# Patient Record
Sex: Female | Born: 1980 | Race: Black or African American | Hispanic: No | Marital: Married | Smoking: Never smoker
Health system: Southern US, Community
[De-identification: ages and names within clinical notes are randomized; demographics above are authoritative.]

## PROBLEM LIST (undated history)

## (undated) DIAGNOSIS — Z789 Other specified health status: Secondary | ICD-10-CM

## (undated) HISTORY — PX: MOUTH SURGERY: SHX715

## (undated) HISTORY — PX: GALLBLADDER SURGERY: SHX652

## (undated) HISTORY — DX: Other specified health status: Z78.9

---

## 2015-11-14 DIAGNOSIS — E669 Obesity, unspecified: Secondary | ICD-10-CM | POA: Insufficient documentation

## 2018-03-07 ENCOUNTER — Encounter: Payer: Self-pay | Admitting: Advanced Practice Midwife

## 2018-03-07 ENCOUNTER — Ambulatory Visit (INDEPENDENT_AMBULATORY_CARE_PROVIDER_SITE_OTHER): Payer: BC Managed Care – PPO | Admitting: Advanced Practice Midwife

## 2018-03-07 VITALS — BP 100/70 | Ht 69.0 in | Wt 208.0 lb

## 2018-03-07 DIAGNOSIS — Z0000 Encounter for general adult medical examination without abnormal findings: Secondary | ICD-10-CM

## 2018-03-07 DIAGNOSIS — Z01419 Encounter for gynecological examination (general) (routine) without abnormal findings: Secondary | ICD-10-CM | POA: Diagnosis not present

## 2018-03-07 DIAGNOSIS — Z3041 Encounter for surveillance of contraceptive pills: Secondary | ICD-10-CM

## 2018-03-07 MED ORDER — DESOGESTREL-ETHINYL ESTRADIOL 0.15-30 MG-MCG PO TABS: ORAL_TABLET | ORAL | 4 refills | Status: DC

## 2018-03-07 NOTE — Progress Notes (Signed)
Patient ID: Julie Conrad, female   DOB: [birth year], 37 y.o.   MRN: [id number]     Gynecology Annual Exam   PCP: Patient, No Pcp Per  Chief Complaint:  Chief Complaint  Patient presents with  . Gynecologic Exam  . Contraception    History of Present Illness: Patient is a 37 y.o. G3 P2012 presents for annual exam. The patient has no complaints today. She is establishing care in this area and needs a refill of her birth control.  LMP: Patient's last menstrual period was 02/18/2018. Average Interval: irregular, every 2-3 months  Duration of flow: 7 days Heavy Menses: no Clots: no Intermenstrual Bleeding: no Postcoital Bleeding: no Dysmenorrhea: no  The patient is sexually active. She currently uses OCP (estrogen/progesterone) for contraception. She denies dyspareunia.  The patient does occasionally perform self breast exams.  There is no notable family history of breast or ovarian cancer in her family. Her maternal grandmother was diagnosed with breast cancer at age 38. There is a possibility that her paternal grandmother had ovarian cancer but she is uncertain of the history of that side of her family.  The patient wears seatbelts: yes.   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 She does not focus on healthy diet but generally limits sugar, fried foods and processed foods. She admits adequate hydration and adequate sleep..    The patient denies current symptoms of depression.    Review of Systems: Review of Systems  Constitutional: Negative.   HENT: Negative.   Eyes: Negative.   Respiratory: Negative.   Cardiovascular: Negative.   Gastrointestinal: Negative.   Genitourinary: Negative.   Musculoskeletal: Negative.   Skin: Negative.   Neurological: Negative.   Endo/Heme/Allergies: Negative.   Psychiatric/Behavioral: Negative.     Past Medical History:  History reviewed. No pertinent  past medical history.  Past Surgical History:  Past Surgical History:  Procedure Laterality Date  . GALLBLADDER SURGERY    . MOUTH SURGERY      Gynecologic History:  Patient's last menstrual period was 02/18/2018. Contraception: OCP (estrogen/progesterone) Last Pap: 1 year ago Results were: no abnormalities   Obstetric History: No obstetric history on file.  Family History:  Family History  Problem Relation Age of Onset  . Diabetes Father   . Cancer Maternal Grandmother   . Cancer Maternal Grandfather   . Ovarian cancer Paternal Grandmother     Social History:  Social History   Socioeconomic History  . Marital status: Married    Spouse name: Not on file  . Number of children: Not on file  . Years of education: Not on file  . Highest education level: Not on file  Occupational History  . Not on file  Social Needs  . Financial resource strain: Not on file  . Food insecurity:    Worry: Not on file    Inability: Not on file  . Transportation needs:    Medical: Not on file    Non-medical: Not on file  Tobacco Use  . Smoking status: Never Smoker  . Smokeless tobacco: Never Used  Substance and Sexual Activity  . Alcohol use: Never    Frequency: Never  . Drug use: Never  . Sexual activity: Yes    Birth control/protection: None  Lifestyle  . Physical activity:    Days per week: Not on file    Minutes per session: Not on file  . Stress: Not on file  Relationships  . Social connections:    Talks on phone: Not on file    Gets together: Not on file    Attends religious service: Not on file    Active member of club or organization: Not on file    Attends meetings of clubs or organizations: Not on file    Relationship status: Not on file  . Intimate partner violence:    Fear of current or ex partner: Not on file    Emotionally abused: Not on file    Physically abused: Not on file    Forced sexual activity: Not on file  Other Topics Concern  . Not on file    Social History Narrative  . Not on file    Allergies:  No Known Allergies  Medications: Prior to Admission medications   Medication Sig Start Date End Date Taking? Authorizing Provider  desogestrel-ethinyl estradiol (APRI,EMOQUETTE,SOLIA) 0.15-30 MG-MCG tablet Take active pills continuously skipping placebo pills,  stopping for only 3 days when bleed or desires bleed 03/07/18  Yes Tresea MallGledhill, Devina Bezold, CNM    Physical Exam Vitals: Blood pressure 100/70, height 5\' 9"  (1.753 m), weight 208 lb (94.3 kg), last menstrual period 02/18/2018.  General: NAD HEENT: normocephalic, anicteric Thyroid: no enlargement, no palpable nodules Pulmonary: No increased work of breathing, CTAB Cardiovascular: RRR, distal pulses 2+ Breast: Breast symmetrical, no tenderness, no palpable nodules or masses, no skin or nipple retraction present, no nipple discharge.  No axillary or supraclavicular lymphadenopathy. Abdomen: NABS, soft, non-tender, non-distended.  Umbilicus without lesions.  No hepatomegaly, splenomegaly or masses palpable. No evidence of hernia  Genitourinary: deferred for no concerns/PAP interval/shared decision making Extremities: no edema, erythema, or tenderness Neurologic: Grossly intact Psychiatric: mood appropriate, affect full    Assessment: 37 y.o. G3 P2012. routine annual exam  Plan: Problem List Items Addressed This Visit    None    Visit Diagnoses    Well woman exam without gynecological exam    -  Primary   Encounter for surveillance of contraceptive pills       Relevant Medications   desogestrel-ethinyl estradiol (APRI,EMOQUETTE,SOLIA) 0.15-30 MG-MCG tablet      1) STI screening  was offered and declined  2)  ASCCP guidelines and rationale discussed.  Patient opts for every 3 years screening interval  3) Contraception - the patient is currently using  OCP (estrogen/progesterone).  She is happy with her current form of contraception and plans to continue  4) Routine  healthcare maintenance including cholesterol, diabetes screening discussed Declines  5) Return in 1 year (on 03/08/2019) for annual established gyn.   Tresea MallJane Peg Fifer, CNM [facility], [facility] 03/07/2018, 8:46 AM

## 2018-03-07 NOTE — Patient Instructions (Signed)
American Heart Association (AHA) Exercise Recommendation  Being physically active is important to prevent heart disease and stroke, the nation's No. 1and No. 5killers. To improve overall cardiovascular health, we suggest at least 150 minutes per week of moderate exercise or 75 minutes per week of vigorous exercise (or a combination of moderate and vigorous activity). Thirty minutes a day, five times a week is an easy goal to remember. You will also experience benefits even if you divide your time into two or three segments of 10 to 15 minutes per day.  For people who would benefit from lowering their blood pressure or cholesterol, we recommend 40 minutes of aerobic exercise of moderate to vigorous intensity three to four times a week to lower the risk for heart attack and stroke.  Physical activity is anything that makes you move your body and burn calories.  This includes things like climbing stairs or playing sports. Aerobic exercises benefit your heart, and include walking, jogging, swimming or biking. Strength and stretching exercises are best for overall stamina and flexibility.  The simplest, positive change you can make to effectively improve your heart health is to start walking. It's enjoyable, free, easy, social and great exercise. A walking program is flexible and boasts high success rates because people can stick with it. It's easy for walking to become a regular and satisfying part of life.   For Overall Cardiovascular Health:  At least 30 minutes of moderate-intensity aerobic activity at least 5 days per week for a total of 150  OR   At least 25 minutes of vigorous aerobic activity at least 3 days per week for a total of 75 minutes; or a combination of moderate- and vigorous-intensity aerobic activity  AND   Moderate- to high-intensity muscle-strengthening activity at least 2 days per week for additional health benefits.  For Lowering Blood Pressure and Cholesterol  An  average 40 minutes of moderate- to vigorous-intensity aerobic activity 3 or 4 times per week  What if I can't make it to the time goal? Something is always better than nothing! And everyone has to start somewhere. Even if you've been sedentary for years, today is the day you can begin to make healthy changes in your life. If you don't think you'll make it for 30 or 40 minutes, set a reachable goal for today. You can work up toward your overall goal by increasing your time as you get stronger. Don't let all-or-nothing thinking rob you of doing what you can every day.  Source:http://www.heart.org   Mediterranean Diet A Mediterranean diet refers to food and lifestyle choices that are based on the traditions of countries located on the [employer]. This way of eating has been shown to help prevent certain conditions and improve outcomes for people who have chronic diseases, like kidney disease and heart disease. What are tips for following this plan? Lifestyle  Cook and eat meals together with your family, when possible.  Drink enough fluid to keep your urine clear or pale yellow.  Be physically active every day. This includes: ? Aerobic exercise like running or swimming. ? Leisure activities like gardening, walking, or housework.  Get 7-8 hours of sleep each night.  If recommended by your health care provider, drink red wine in moderation. This means 1 glass a day for nonpregnant women and 2 glasses a day for men. A glass of wine equals 5 oz (150 mL). Reading food labels   Check the serving size of packaged foods. For foods such as  rice and pasta, the serving size refers to the amount of cooked product, not dry.  Check the total fat in packaged foods. Avoid foods that have saturated fat or trans fats.  Check the ingredients list for added sugars, such as corn syrup. Shopping  At the grocery store, buy most of your food from the areas near the walls of the store. This  includes: ? Fresh fruits and vegetables (produce). ? Grains, beans, nuts, and seeds. Some of these may be available in unpackaged forms or large amounts (in bulk). ? Fresh seafood. ? Poultry and eggs. ? Low-fat dairy products.  Buy whole ingredients instead of prepackaged foods.  Buy fresh fruits and vegetables in-season from local farmers markets.  Buy frozen fruits and vegetables in resealable bags.  If you do not have access to quality fresh seafood, buy precooked frozen shrimp or canned fish, such as tuna, salmon, or sardines.  Buy small amounts of raw or cooked vegetables, salads, or olives from the deli or salad bar at your store.  Stock your pantry so you always have certain foods on hand, such as olive oil, canned tuna, canned tomatoes, rice, pasta, and beans. Cooking  Cook foods with extra-virgin olive oil instead of using butter or other vegetable oils.  Have meat as a side dish, and have vegetables or grains as your main dish. This means having meat in small portions or adding small amounts of meat to foods like pasta or stew.  Use beans or vegetables instead of meat in common dishes like chili or lasagna.  Experiment with different cooking methods. Try roasting or broiling vegetables instead of steaming or sauteing them.  Add frozen vegetables to soups, stews, pasta, or rice.  Add nuts or seeds for added healthy fat at each meal. You can add these to yogurt, salads, or vegetable dishes.  Marinate fish or vegetables using olive oil, lemon juice, garlic, and fresh herbs. Meal planning   Plan to eat 1 vegetarian meal one day each week. Try to work up to 2 vegetarian meals, if possible.  Eat seafood 2 or more times a week.  Have healthy snacks readily available, such as: ? Vegetable sticks with hummus. ? [place] yogurt. ? Fruit and nut trail mix.  Eat balanced meals throughout the week. This includes: ? Fruit: 2-3 servings a day ? Vegetables: 4-5 servings a  day ? Low-fat dairy: 2 servings a day ? Fish, poultry, or lean meat: 1 serving a day ? Beans and legumes: 2 or more servings a week ? Nuts and seeds: 1-2 servings a day ? Whole grains: 6-8 servings a day ? Extra-virgin olive oil: 3-4 servings a day  Limit red meat and sweets to only a few servings a month What are my food choices?  Mediterranean diet ? Recommended ? Grains: Whole-grain pasta. Brown rice. Bulgar wheat. Polenta. Couscous. Whole-wheat bread. [place]. ? Vegetables: Artichokes. Beets. Broccoli. Cabbage. Carrots. Eggplant. Green beans. Chard. Kale. Spinach. Onions. Leeks. Peas. Squash. Tomatoes. Peppers. Radishes. ? Fruits: Apples. Apricots. Avocado. Berries. Bananas. Cherries. Dates. Figs. Grapes. Lemons. Melon. Oranges. Peaches. Plums. Pomegranate. ? Meats and other protein foods: Beans. Almonds. Sunflower seeds. Pine nuts. Peanuts. [place]. Salmon. Scallops. Shrimp. [place]. Tilapia. Clams. Oysters. Eggs. ? Dairy: Low-fat milk. Cheese. Greek yogurt. ? Beverages: Water. Red wine. Herbal tea. ? Fats and oils: Extra virgin olive oil. Avocado oil. Grape seed oil. ? Sweets and desserts: [place] yogurt with honey. Baked apples. Poached pears. Trail mix. ? Seasoning and other foods: Basil.  Cilantro. Coriander. Cumin. Mint. Parsley. Sage. Rosemary. Tarragon. Garlic. Oregano. Thyme. Pepper. Balsalmic vinegar. Tahini. Hummus. Tomato sauce. Olives. Mushrooms. ? Limit these ? Grains: Prepackaged pasta or rice dishes. Prepackaged cereal with added sugar. ? Vegetables: Deep fried potatoes (french fries). ? Fruits: Fruit canned in syrup. ? Meats and other protein foods: Beef. Pork. Lamb. Poultry with skin. Hot dogs. Bernie[place] Salines. ? Dairy: Ice cream. Sour cream. Whole milk. ? Beverages: Juice. Sugar-sweetened soft drinks. Beer. Liquor and spirits. ? Fats and oils: Butter. Canola oil. Vegetable oil. Beef fat (tallow). Lard. ? Sweets and desserts: Cookies. Cakes. Pies. Candy. ? Seasoning and other  foods: Mayonnaise. Premade sauces and marinades. ? The items listed may not be a complete list. Talk with your dietitian about what dietary choices are right for you. Summary  The Mediterranean diet includes both food and lifestyle choices.  Eat a variety of fresh fruits and vegetables, beans, nuts, seeds, and whole grains.  Limit the amount of red meat and sweets that you eat.  Talk with your health care provider about whether it is safe for you to drink red wine in moderation. This means 1 glass a day for nonpregnant women and 2 glasses a day for men. A glass of wine equals 5 oz (150 mL). This information is not intended to replace advice given to you by your health care provider. Make sure you discuss any questions you have with your health care provider. Document Released: 10/23/2015 Document Revised: 11/25/2015 Document Reviewed: 10/23/2015 Elsevier Interactive Patient Education  2019 [place] 18-39 Years, Female Preventive care refers to lifestyle choices and visits with your health care provider that can promote health and wellness. What does preventive care include?   A yearly physical exam. This is also called an annual well check.  Dental exams once or twice a year.  Routine eye exams. Ask your health care provider how often you should have your eyes checked.  Personal lifestyle choices, including: ? Daily care of your teeth and gums. ? Regular physical activity. ? Eating a healthy diet. ? Avoiding tobacco and drug use. ? Limiting alcohol use. ? Practicing safe sex. ? Taking vitamin and mineral supplements as recommended by your health care provider. What happens during an annual well check? The services and screenings done by your health care provider during your annual well check will depend on your age, overall health, lifestyle risk factors, and family history of disease. Counseling Your health care provider may ask you questions about  your:  Alcohol use.  Tobacco use.  Drug use.  Emotional well-being.  Home and relationship well-being.  Sexual activity.  Eating habits.  Work and work Statistician.  Method of birth control.  Menstrual cycle.  Pregnancy history. Screening You may have the following tests or measurements:  Height, weight, and BMI.  Diabetes screening. This is done by checking your blood sugar (glucose) after you have not eaten for a while (fasting).  Blood pressure.  Lipid and cholesterol levels. These may be checked every 5 years starting at age 65.  Skin check.  Hepatitis C blood test.  Hepatitis B blood test.  Sexually transmitted disease (STD) testing.  BRCA-related cancer screening. This may be done if you have a family history of breast, ovarian, tubal, or peritoneal cancers.  Pelvic exam and Pap test. This may be done every 3 years starting at age 52. Starting at age 58, this may be done every 5 years if you have a Pap test in combination with  an HPV test. Discuss your test results, treatment options, and if necessary, the need for more tests with your health care provider. Vaccines Your health care provider may recommend certain vaccines, such as:  Influenza vaccine. This is recommended every year.  Tetanus, diphtheria, and acellular pertussis (Tdap, Td) vaccine. You may need a Td booster every 10 years.  Varicella vaccine. You may need this if you have not been vaccinated.  HPV vaccine. If you are 74 or younger, you may need three doses over 6 months.  Measles, mumps, and rubella (MMR) vaccine. You may need at least one dose of MMR. You may also need a second dose.  Pneumococcal 13-valent conjugate (PCV13) vaccine. You may need this if you have certain conditions and were not previously vaccinated.  Pneumococcal polysaccharide (PPSV23) vaccine. You may need one or two doses if you smoke cigarettes or if you have certain conditions.  Meningococcal vaccine. One  dose is recommended if you are age 56-21 years and a first-year college student living in a residence hall, or if you have one of several medical conditions. You may also need additional booster doses.  Hepatitis A vaccine. You may need this if you have certain conditions or if you travel or work in places where you may be exposed to hepatitis A.  Hepatitis B vaccine. You may need this if you have certain conditions or if you travel or work in places where you may be exposed to hepatitis B.  Haemophilus influenzae type b (Hib) vaccine. You may need this if you have certain risk factors. Talk to your health care provider about which screenings and vaccines you need and how often you need them. This information is not intended to replace advice given to you by your health care provider. Make sure you discuss any questions you have with your health care provider. Document Released: 04/27/2001 Document Revised: 10/12/2016 Document Reviewed: 12/31/2014 Elsevier Interactive Patient Education  2019 [employer].

## 2019-01-11 ENCOUNTER — Other Ambulatory Visit: Payer: Self-pay | Admitting: Advanced Practice Midwife

## 2019-01-11 DIAGNOSIS — Z3041 Encounter for surveillance of contraceptive pills: Secondary | ICD-10-CM

## 2019-03-13 ENCOUNTER — Other Ambulatory Visit: Payer: Self-pay

## 2019-03-13 ENCOUNTER — Ambulatory Visit: Payer: BC Managed Care – PPO | Attending: Internal Medicine

## 2019-03-13 DIAGNOSIS — Z20822 Contact with and (suspected) exposure to covid-19: Secondary | ICD-10-CM

## 2019-03-15 ENCOUNTER — Telehealth: Payer: Self-pay | Admitting: *Deleted

## 2019-03-15 LAB — NOVEL CORONAVIRUS, NAA: SARS-CoV-2, NAA: NOT DETECTED

## 2019-03-15 NOTE — Telephone Encounter (Signed)
Patient called for results ,still pending . 

## 2019-03-19 ENCOUNTER — Ambulatory Visit: Payer: BC Managed Care – PPO | Admitting: Advanced Practice Midwife

## 2019-03-19 ENCOUNTER — Telehealth: Payer: Self-pay | Admitting: Advanced Practice Midwife

## 2019-03-19 NOTE — Telephone Encounter (Signed)
Patient was schedule today for appointment but didn't have child care. Patient is reschedule to 04/13/19. Patient is requesting refill on birth control to get to her appointment. Please advise

## 2019-03-20 ENCOUNTER — Other Ambulatory Visit: Payer: Self-pay | Admitting: Advanced Practice Midwife

## 2019-03-20 DIAGNOSIS — Z3041 Encounter for surveillance of contraceptive pills: Secondary | ICD-10-CM

## 2019-03-20 NOTE — Telephone Encounter (Signed)
Refill sent in. I was not in office yesterday, that is why I did not see it until this AM

## 2019-04-13 ENCOUNTER — Ambulatory Visit (INDEPENDENT_AMBULATORY_CARE_PROVIDER_SITE_OTHER): Payer: BC Managed Care – PPO | Admitting: Advanced Practice Midwife

## 2019-04-13 ENCOUNTER — Other Ambulatory Visit: Payer: Self-pay

## 2019-04-13 ENCOUNTER — Encounter: Payer: Self-pay | Admitting: Advanced Practice Midwife

## 2019-04-13 VITALS — BP 120/80 | Ht 69.0 in | Wt 217.0 lb

## 2019-04-13 DIAGNOSIS — Z3041 Encounter for surveillance of contraceptive pills: Secondary | ICD-10-CM | POA: Diagnosis not present

## 2019-04-13 DIAGNOSIS — Z0000 Encounter for general adult medical examination without abnormal findings: Secondary | ICD-10-CM | POA: Diagnosis not present

## 2019-04-13 MED ORDER — APRI 0.15-30 MG-MCG PO TABS: ORAL_TABLET | ORAL | 4 refills | Status: DC

## 2019-04-13 NOTE — Progress Notes (Signed)
Gynecology Annual Exam   PCP: Patient, No Pcp Per  Chief Complaint:  Chief Complaint  Patient presents with  . Gynecologic Exam    History of Present Illness: Patient is a 39 y.o. Y1O1751 presents for annual exam. The patient has no gyn complaints today.   LMP: Patient's last menstrual period was 02/03/2019. Average Interval: every 2-3 months when she takes placebo pills Duration of flow: 5 days Heavy Menses: no Clots: no Intermenstrual Bleeding: no Postcoital Bleeding: no Dysmenorrhea: no  The patient is sexually active. She currently uses OCP (estrogen/progesterone) for contraception. She denies dyspareunia.  The patient does perform self breast exams.  There is no notable family history of breast or ovarian cancer in her family. Please see note from last year regarding uncertain history of ovarian cancer.  The patient wears seatbelts: yes.   The patient has regular exercise: she does a regular workout. She admits healthy diet, adequate hydration and adequate sleep.    The patient denies current symptoms of depression.    Review of Systems: Review of Systems  Constitutional: Negative.   HENT: Negative.   Eyes: Negative.   Respiratory: Negative.   Cardiovascular: Negative.   Gastrointestinal: Negative.   Genitourinary: Negative.   Musculoskeletal: Negative.   Skin: Negative.   Neurological: Negative.   Endo/Heme/Allergies: Negative.   Psychiatric/Behavioral: Negative.     Past Medical History:  History reviewed. No pertinent past medical history.  Past Surgical History:  Past Surgical History:  Procedure Laterality Date  . GALLBLADDER SURGERY    . MOUTH SURGERY      Gynecologic History:  Patient's last menstrual period was 02/03/2019. Contraception: OCP (estrogen/progesterone) Last Pap: 2 years ago at [facility] Results were: no abnormalities   Obstetric History: [id number]  Family History:  Family History  Problem Relation Age of Onset  .  Diabetes Father   . Cancer Maternal Grandmother   . Cancer Maternal Grandfather   . Ovarian cancer Paternal Grandmother     Social History:  Social History   Socioeconomic History  . Marital status: Married    Spouse name: Not on file  . Number of children: Not on file  . Years of education: Not on file  . Highest education level: Not on file  Occupational History  . Not on file  Tobacco Use  . Smoking status: Never Smoker  . Smokeless tobacco: Never Used  Substance and Sexual Activity  . Alcohol use: Never  . Drug use: Never  . Sexual activity: Yes    Birth control/protection: None  Other Topics Concern  . Not on file  Social History Narrative  . Not on file   Social Determinants of Health   Financial Resource Strain:   . Difficulty of Paying Living Expenses: Not on file  Food Insecurity:   . Worried About Programme researcher, broadcasting/film/video in the Last Year: Not on file  . Ran Out of Food in the Last Year: Not on file  Transportation Needs:   . Lack of Transportation (Medical): Not on file  . Lack of Transportation (Non-Medical): Not on file  Physical Activity:   . Days of Exercise per Week: Not on file  . Minutes of Exercise per Session: Not on file  Stress:   . Feeling of Stress : Not on file  Social Connections:   . Frequency of Communication with Friends and Family: Not on file  . Frequency of Social Gatherings with Friends and Family: Not on file  . Attends  Religious Services: Not on file  . Active Member of Clubs or Organizations: Not on file  . Attends Archivist Meetings: Not on file  . Marital Status: Not on file  Intimate Partner Violence:   . Fear of Current or Ex-Partner: Not on file  . Emotionally Abused: Not on file  . Physically Abused: Not on file  . Sexually Abused: Not on file    Allergies:  No Known Allergies  Medications: Prior to Admission medications   Medication Sig Start Date End Date Taking? Authorizing Provider   desogestrel-ethinyl estradiol (APRI) 0.15-30 MG-MCG tablet TAKE 1 TABLET BY MOUTH EVERY DAY CONTINUOUSLY SKIPPING PLACEBO PILLS. STOPPING FOR ONLY 3 DAYS WHEN BLEED OR DESIRES BLEED 04/13/19  Yes Rod Can, CNM    Physical Exam Vitals: Blood pressure 120/80, height 5\' 9"  (1.753 m), weight 217 lb (98.4 kg), last menstrual period 02/03/2019.  General: NAD HEENT: normocephalic, anicteric Thyroid: no enlargement, no palpable nodules Pulmonary: No increased work of breathing, CTAB Cardiovascular: RRR, distal pulses 2+ Breast: Breast symmetrical, no tenderness, no palpable nodules or masses, no skin or nipple retraction present, no nipple discharge.  No axillary or supraclavicular lymphadenopathy. Abdomen: NABS, soft, non-tender, non-distended.  Umbilicus without lesions.  No hepatomegaly, splenomegaly or masses palpable. No evidence of hernia  Genitourinary: deferred for no concerns/PAP interval Extremities: no edema, erythema, or tenderness Neurologic: Grossly intact Psychiatric: mood appropriate, affect full    Assessment: 39 y.o. [id number] routine annual exam  Plan: Problem List Items Addressed This Visit    None    Visit Diagnoses    Well woman exam without gynecological exam    -  Primary   Relevant Orders   CBC with Differential/Platelet   Hgb A1c w/o eAG   Lipid Panel With LDL/HDL Ratio   Blood tests for routine general physical examination       Relevant Orders   CBC with Differential/Platelet   Hgb A1c w/o eAG   Lipid Panel With LDL/HDL Ratio   Encounter for surveillance of contraceptive pills       Relevant Medications   desogestrel-ethinyl estradiol (APRI) 0.15-30 MG-MCG tablet      1) STI screening  was offered and declined  2)  ASCCP guidelines and rationale discussed.  Patient opts for every 3 years screening interval. PAP is due in 1 year.  3) Contraception - the patient is currently using  OCP (estrogen/progesterone).  She is happy with her current form of  contraception and plans to continue  4) Routine healthcare maintenance including cholesterol, diabetes screening discussed Ordered today  5) Return in about 1 year (around 04/12/2020) for annual established gyn.   Rod Can, [facility] 04/13/2019, 3:47 PM

## 2019-04-14 LAB — CBC WITH DIFFERENTIAL/PLATELET
Basophils Absolute: 0.1 10*3/uL (ref 0.0–0.2)
Basos: 1 %
EOS (ABSOLUTE): 0.1 10*3/uL (ref 0.0–0.4)
Eos: 2 %
Hematocrit: 36.8 % (ref 34.0–46.6)
Hemoglobin: 11.9 g/dL (ref 11.1–15.9)
Immature Grans (Abs): 0 10*3/uL (ref 0.0–0.1)
Immature Granulocytes: 0 %
Lymphocytes Absolute: 2.1 10*3/uL (ref 0.7–3.1)
Lymphs: 38 %
MCH: 30.2 pg (ref 26.6–33.0)
MCHC: 32.3 g/dL (ref 31.5–35.7)
MCV: 93 fL (ref 79–97)
Monocytes Absolute: 0.5 10*3/uL (ref 0.1–0.9)
Monocytes: 9 %
Neutrophils Absolute: 2.6 10*3/uL (ref 1.4–7.0)
Neutrophils: 50 %
Platelets: 381 10*3/uL (ref 150–450)
RBC: 3.94 x10E6/uL (ref 3.77–5.28)
RDW: 12.3 % (ref 11.7–15.4)
WBC: 5.4 10*3/uL (ref 3.4–10.8)

## 2019-04-14 LAB — LIPID PANEL WITH LDL/HDL RATIO
Cholesterol, Total: 140 mg/dL (ref 100–199)
HDL: 74 mg/dL (ref >39)
LDL Chol Calc (NIH): 49 mg/dL (ref 0–99)
LDL/HDL Ratio: 0.7 ratio (ref 0.0–3.2)
Triglycerides: 94 mg/dL (ref 0–149)
VLDL Cholesterol Cal: 17 mg/dL (ref 5–40)

## 2019-04-14 LAB — HGB A1C W/O EAG: Hgb A1c MFr Bld: 4.8 % (ref 4.8–5.6)

## 2020-01-28 ENCOUNTER — Other Ambulatory Visit: Payer: Self-pay | Admitting: Advanced Practice Midwife

## 2020-01-28 DIAGNOSIS — Z1231 Encounter for screening mammogram for malignant neoplasm of breast: Secondary | ICD-10-CM

## 2020-02-06 ENCOUNTER — Ambulatory Visit
Admission: RE | Admit: 2020-02-06 | Discharge: 2020-02-06 | Disposition: A | Discharge status: Home / Self Care | Payer: BC Managed Care – PPO | Source: Ambulatory Visit | Attending: Advanced Practice Midwife | Admitting: Advanced Practice Midwife

## 2020-02-06 ENCOUNTER — Other Ambulatory Visit: Payer: Self-pay

## 2020-02-06 DIAGNOSIS — Z1231 Encounter for screening mammogram for malignant neoplasm of breast: Secondary | ICD-10-CM | POA: Diagnosis not present

## 2020-04-15 ENCOUNTER — Other Ambulatory Visit: Payer: Self-pay

## 2020-04-15 ENCOUNTER — Other Ambulatory Visit (HOSPITAL_COMMUNITY)
Admission: RE | Admit: 2020-04-15 | Discharge: 2020-04-15 | Disposition: A | Discharge status: Home / Self Care | Payer: BC Managed Care – PPO | Source: Ambulatory Visit | Attending: Advanced Practice Midwife | Admitting: Advanced Practice Midwife

## 2020-04-15 ENCOUNTER — Ambulatory Visit (INDEPENDENT_AMBULATORY_CARE_PROVIDER_SITE_OTHER): Payer: BC Managed Care – PPO | Admitting: Advanced Practice Midwife

## 2020-04-15 ENCOUNTER — Encounter: Payer: Self-pay | Admitting: Advanced Practice Midwife

## 2020-04-15 VITALS — BP 120/80 | Ht 69.0 in | Wt 229.0 lb

## 2020-04-15 DIAGNOSIS — Z130 Encounter for screening for diseases of the blood and blood-forming organs and certain disorders involving the immune mechanism: Secondary | ICD-10-CM

## 2020-04-15 DIAGNOSIS — Z131 Encounter for screening for diabetes mellitus: Secondary | ICD-10-CM

## 2020-04-15 DIAGNOSIS — Z124 Encounter for screening for malignant neoplasm of cervix: Secondary | ICD-10-CM | POA: Insufficient documentation

## 2020-04-15 DIAGNOSIS — Z1239 Encounter for other screening for malignant neoplasm of breast: Secondary | ICD-10-CM

## 2020-04-15 DIAGNOSIS — Z01419 Encounter for gynecological examination (general) (routine) without abnormal findings: Secondary | ICD-10-CM | POA: Insufficient documentation

## 2020-04-15 DIAGNOSIS — Z3041 Encounter for surveillance of contraceptive pills: Secondary | ICD-10-CM

## 2020-04-15 DIAGNOSIS — Z13220 Encounter for screening for lipoid disorders: Secondary | ICD-10-CM

## 2020-04-15 MED ORDER — DESOGESTREL-ETHINYL ESTRADIOL 0.15-30 MG-MCG PO TABS: ORAL_TABLET | ORAL | 4 refills | Status: DC

## 2020-04-16 LAB — LIPID PANEL WITH LDL/HDL RATIO
Cholesterol, Total: 155 mg/dL (ref 100–199)
HDL: 84 mg/dL (ref >39)
LDL Chol Calc (NIH): 56 mg/dL (ref 0–99)
LDL/HDL Ratio: 0.7 ratio (ref 0.0–3.2)
Triglycerides: 81 mg/dL (ref 0–149)
VLDL Cholesterol Cal: 15 mg/dL (ref 5–40)

## 2020-04-16 LAB — CBC
Hematocrit: 36 % (ref 34.0–46.6)
Hemoglobin: 12.1 g/dL (ref 11.1–15.9)
MCH: 30.9 pg (ref 26.6–33.0)
MCHC: 33.6 g/dL (ref 31.5–35.7)
MCV: 92 fL (ref 79–97)
Platelets: 444 10*3/uL (ref 150–450)
RBC: 3.92 x10E6/uL (ref 3.77–5.28)
RDW: 11.7 % (ref 11.7–15.4)
WBC: 5.6 10*3/uL (ref 3.4–10.8)

## 2020-04-16 LAB — HGB A1C W/O EAG: Hgb A1c MFr Bld: 5 % (ref 4.8–5.6)

## 2020-04-17 ENCOUNTER — Encounter: Payer: Self-pay | Admitting: Advanced Practice Midwife

## 2020-04-17 NOTE — Progress Notes (Signed)
Gynecology Annual Exam  Date of Service: 04/15/2020  PCP: Julie Conrad, No Pcp Per  Chief Complaint:  Chief Complaint  Julie Conrad presents with  . Annual Exam    History of Present Illness: Julie Conrad is a 40 y.o. B2I2035 presents for annual exam. The Julie Conrad has no gyn complaints today. She mentions gaining weight in the past year due to decreased activity during Covid and increased snacking.   LMP: No LMP recorded. (Menstrual status: Oral contraceptives). Average Interval: every 3 months Duration of flow: 4-5 days Heavy Menses: no Clots: no Intermenstrual Bleeding: no Postcoital Bleeding: no Dysmenorrhea: no   The Julie Conrad is sexually active. She currently uses OCP (estrogen/progesterone) for contraception. She denies dyspareunia.  The Julie Conrad does perform self breast exams.  There is no notable family history of breast or ovarian cancer in her family. Her maternal grandmother was diagnosed with breast cancer at age 44. There is a possibility that her paternal grandmother had ovarian cancer but she is uncertain of the history of that side of her family.  The Julie Conrad wears seatbelts: yes.   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    The Julie Conrad denies current symptoms of depression.    Review of Systems: Review of Systems  Constitutional: Negative for chills and fever.  HENT: Negative for congestion, ear discharge, ear pain, hearing loss, sinus pain and sore throat.   Eyes: Negative for blurred vision and double vision.  Respiratory: Negative for cough, shortness of breath and wheezing.   Cardiovascular: Negative for chest pain, palpitations and leg swelling.  Gastrointestinal: Negative for abdominal pain, blood in stool, constipation, diarrhea, heartburn, melena, nausea and vomiting.  Genitourinary: Negative for dysuria, flank pain, frequency, hematuria and urgency.   Musculoskeletal: Negative for back pain, joint pain and myalgias.  Skin: Negative for itching and rash.  Neurological: Negative for dizziness, tingling, tremors, sensory change, speech change, focal weakness, seizures, loss of consciousness, weakness and headaches.  Endo/Heme/Allergies: Negative for environmental allergies. Does not bruise/bleed easily.  Psychiatric/Behavioral: Negative for depression, hallucinations, memory loss, substance abuse and suicidal ideas. The Julie Conrad is not nervous/anxious and does not have insomnia.     Past Medical History:  There are no problems to display for this Julie Conrad.   Past Surgical History:  Past Surgical History:  Procedure Laterality Date  . GALLBLADDER SURGERY    . MOUTH SURGERY      Gynecologic History:  No LMP recorded. (Menstrual status: Oral contraceptives). Contraception: OCP (estrogen/progesterone) Last Pap: 3 or 4 years ago Results were:  no abnormalities  Last mammogram: a few months ago Results were: BI-RAD I  Obstetric History: [id number]  Family History:  Family History  Problem Relation Age of Onset  . Diabetes Father   . Cancer Maternal Grandmother   . Breast cancer Maternal Grandmother 90  . Cancer Maternal Grandfather   . Ovarian cancer Paternal Grandmother     Social History:  Social History   Socioeconomic History  . Marital status: Married    Spouse name: Not on file  . Number of children: Not on file  . Years of education: Not on file  . Highest education level: Not on file  Occupational History  . Not on file  Tobacco Use  . Smoking status: Never Smoker  . Smokeless tobacco: Never Used  Substance and Sexual Activity  . Alcohol use: Never  . Drug use: Never  .  Sexual activity: Yes    Birth control/protection: None  Other Topics Concern  . Not on file  Social History Narrative  . Not on file   Social Determinants of Health   Financial Resource Strain: Not on file  Food Insecurity: Not on file   Transportation Needs: Not on file  Physical Activity: Not on file  Stress: Not on file  Social Connections: Not on file  Intimate Partner Violence: Not on file    Allergies:  No Known Allergies  Medications: Prior to Admission medications   Medication Sig Start Date End Date Taking? Authorizing Provider  desogestrel-ethinyl estradiol (APRI) 0.15-30 MG-MCG tablet TAKE 1 TABLET BY MOUTH EVERY DAY CONTINUOUSLY SKIPPING PLACEBO PILLS. STOPPING FOR ONLY 3 DAYS WHEN BLEED OR DESIRES BLEED 04/15/20   Tresea Mall, CNM    Physical Exam Vitals: Blood pressure 120/80, height 5\' 9"  (1.753 m), weight 229 lb (103.9 kg).  General: NAD HEENT: normocephalic, anicteric Thyroid: no enlargement, no palpable nodules Pulmonary: No increased work of breathing, CTAB Cardiovascular: RRR, distal pulses 2+ Breast: Breast symmetrical, no tenderness, no palpable nodules or masses, no skin or nipple retraction present, no nipple discharge.  No axillary or supraclavicular lymphadenopathy. Abdomen: NABS, soft, non-tender, non-distended.  Umbilicus without lesions.  No hepatomegaly, splenomegaly or masses palpable. No evidence of hernia  Genitourinary:  External: Normal external female genitalia.  Normal urethral meatus, normal Bartholin's and Skene's glands.    Vagina: Normal vaginal mucosa, no evidence of prolapse.    Cervix: Grossly normal in appearance, no bleeding  Uterus: Non-enlarged, mobile, normal contour.  No CMT  Adnexa: ovaries non-enlarged, no adnexal masses  Rectal: deferred  Lymphatic: no evidence of inguinal lymphadenopathy Extremities: no edema, erythema, or tenderness Neurologic: Grossly intact Psychiatric: mood appropriate, affect full   Assessment: 40 y.o. 41 routine annual exam  Plan: Problem List Items Addressed This Visit   None   Visit Diagnoses    Well woman exam with routine gynecological exam    -  Primary   Relevant Orders   CBC (Completed)   Lipid Panel With  LDL/HDL Ratio (Completed)   Hgb A1c w/o eAG (Completed)   Cytology - PAP   MM DIGITAL SCREENING BILATERAL   Screening for cervical cancer       Relevant Orders   Cytology - PAP   Screening for iron deficiency anemia       Relevant Orders   CBC (Completed)   Screening for diabetes mellitus       Relevant Orders   Hgb A1c w/o eAG (Completed)   Screening cholesterol level       Relevant Orders   Lipid Panel With LDL/HDL Ratio (Completed)   Breast screening       Relevant Orders   MM DIGITAL SCREENING BILATERAL   Encounter for surveillance of contraceptive pills       Relevant Medications   desogestrel-ethinyl estradiol (APRI) 0.15-30 MG-MCG tablet      1) Mammogram - recommend yearly screening mammogram.  Mammogram Was ordered today   2) STI screening  was offered and declined  3) ASCCP guidelines and rationale discussed.  Julie Conrad opts for every 3 years screening interval. PAP today  4) Contraception - the Julie Conrad is currently using  OCP (estrogen/progesterone).  She is happy with her current form of contraception and plans to continue  5) Colonoscopy -- Screening recommended starting at age 32 for average risk individuals, age 60 for individuals deemed at increased risk (including African Americans) and recommended to continue  until age 54.  For Julie Conrad age 33-85 individualized approach is recommended.  Gold standard screening is via colonoscopy, Cologuard screening is an acceptable alternative for Julie Conrad unwilling or unable to undergo colonoscopy.  "Colorectal cancer screening for average?risk adults: 2018 guideline update from the American Cancer Society"CA: A Cancer Journal for Clinicians: Aug 11, 2016   6) Routine healthcare maintenance including cholesterol, diabetes screening discussed Ordered today  7) Return in about 1 year (around 04/15/2021) for annual established gyn.   Tresea Mall, CNM [facility] 04/17/2020, 3:20 PM

## 2020-04-23 LAB — CYTOLOGY - PAP
Comment: NEGATIVE
Comment: NEGATIVE
Diagnosis: NEGATIVE
HPV 16: NEGATIVE
HPV 18 / 45: NEGATIVE
High risk HPV: POSITIVE — AB

## 2020-05-13 ENCOUNTER — Encounter: Payer: Self-pay | Admitting: Obstetrics and Gynecology

## 2020-12-24 ENCOUNTER — Telehealth: Payer: Self-pay | Admitting: Family

## 2020-12-24 NOTE — Telephone Encounter (Signed)
Opened in error

## 2021-03-03 ENCOUNTER — Other Ambulatory Visit: Payer: Self-pay | Admitting: Advanced Practice Midwife

## 2021-03-03 DIAGNOSIS — Z3041 Encounter for surveillance of contraceptive pills: Secondary | ICD-10-CM

## 2021-05-03 ENCOUNTER — Other Ambulatory Visit: Payer: Self-pay | Admitting: Obstetrics & Gynecology

## 2021-05-03 DIAGNOSIS — Z3041 Encounter for surveillance of contraceptive pills: Secondary | ICD-10-CM

## 2021-05-29 ENCOUNTER — Other Ambulatory Visit: Payer: Self-pay

## 2021-05-29 DIAGNOSIS — Z3041 Encounter for surveillance of contraceptive pills: Secondary | ICD-10-CM

## 2021-05-29 MED ORDER — DESOGESTREL-ETHINYL ESTRADIOL 0.15-30 MG-MCG PO TABS: ORAL_TABLET | ORAL | 0 refills | Status: DC

## 2021-05-29 NOTE — Telephone Encounter (Signed)
Pt calling; has appt 06/17/21; needs refill of bc to get to appt.  [phone]  Pt aware refill eRx'd. ?

## 2021-06-17 ENCOUNTER — Ambulatory Visit (INDEPENDENT_AMBULATORY_CARE_PROVIDER_SITE_OTHER): Payer: BC Managed Care – PPO | Admitting: Advanced Practice Midwife

## 2021-06-17 ENCOUNTER — Other Ambulatory Visit (HOSPITAL_COMMUNITY)
Admission: RE | Admit: 2021-06-17 | Discharge: 2021-06-17 | Disposition: A | Discharge status: Home / Self Care | Payer: BC Managed Care – PPO | Source: Ambulatory Visit | Attending: Advanced Practice Midwife | Admitting: Advanced Practice Midwife

## 2021-06-17 ENCOUNTER — Encounter: Payer: Self-pay | Admitting: Advanced Practice Midwife

## 2021-06-17 VITALS — BP 120/60 | Ht 69.0 in | Wt 234.0 lb

## 2021-06-17 DIAGNOSIS — Z01419 Encounter for gynecological examination (general) (routine) without abnormal findings: Secondary | ICD-10-CM

## 2021-06-17 DIAGNOSIS — Z3041 Encounter for surveillance of contraceptive pills: Secondary | ICD-10-CM

## 2021-06-17 DIAGNOSIS — Z131 Encounter for screening for diabetes mellitus: Secondary | ICD-10-CM

## 2021-06-17 DIAGNOSIS — Z13220 Encounter for screening for lipoid disorders: Secondary | ICD-10-CM | POA: Diagnosis not present

## 2021-06-17 DIAGNOSIS — Z124 Encounter for screening for malignant neoplasm of cervix: Secondary | ICD-10-CM | POA: Diagnosis not present

## 2021-06-17 DIAGNOSIS — Z1239 Encounter for other screening for malignant neoplasm of breast: Secondary | ICD-10-CM

## 2021-06-17 DIAGNOSIS — Z1321 Encounter for screening for nutritional disorder: Secondary | ICD-10-CM

## 2021-06-17 DIAGNOSIS — Z1329 Encounter for screening for other suspected endocrine disorder: Secondary | ICD-10-CM

## 2021-06-17 MED ORDER — DESOGESTREL-ETHINYL ESTRADIOL 0.15-30 MG-MCG PO TABS: ORAL_TABLET | ORAL | 3 refills | Status: DC

## 2021-06-17 NOTE — Patient Instructions (Signed)
Mediterranean Diet °A Mediterranean diet refers to food and lifestyle choices that are based on the traditions of countries located on the [place] Sea. It focuses on eating more fruits, vegetables, whole grains, beans, nuts, seeds, and heart-healthy fats, and eating less dairy, meat, eggs, and processed foods with added sugar, salt, and fat. This way of eating has been shown to help prevent certain conditions and improve outcomes for people who have chronic diseases, like kidney disease and heart disease. °What are tips for following this plan? °Reading food labels °Check the serving size of packaged foods. For foods such as rice and pasta, the serving size refers to the amount of cooked product, not dry. °Check the total fat in packaged foods. Avoid foods that have saturated fat or trans fats. °Check the ingredient list for added sugars, such as corn syrup. °Shopping ° °Buy a variety of foods that offer a balanced diet, including: °Fresh fruits and vegetables (produce). °Grains, beans, nuts, and seeds. Some of these may be available in unpackaged forms or large amounts (in bulk). °Fresh seafood. °Poultry and eggs. °Low-fat dairy products. °Buy whole ingredients instead of prepackaged foods. °Buy fresh fruits and vegetables in-season from local farmers markets. °Buy plain frozen fruits and vegetables. °If you do not have access to quality fresh seafood, buy precooked frozen shrimp or canned fish, such as tuna, salmon, or sardines. °Stock your pantry so you always have certain foods on hand, such as olive oil, canned tuna, canned tomatoes, rice, pasta, and beans. °Cooking °Cook foods with extra-virgin olive oil instead of using butter or other vegetable oils. °Have meat as a side dish, and have vegetables or grains as your main dish. This means having meat in small portions or adding small amounts of meat to foods like pasta or stew. °Use beans or vegetables instead of meat in common dishes like chili or  lasagna. °Experiment with different cooking methods. Try roasting, broiling, steaming, and sautéing vegetables. °Add frozen vegetables to soups, stews, pasta, or rice. °Add nuts or seeds for added healthy fats and plant protein at each meal. You can add these to yogurt, salads, or vegetable dishes. °Marinate fish or vegetables using olive oil, lemon juice, garlic, and fresh herbs. °Meal planning °Plan to eat one vegetarian meal one day each week. Try to work up to two vegetarian meals, if possible. °Eat seafood two or more times a week. °Have healthy snacks readily available, such as: °Vegetable sticks with hummus. °Greek yogurt. °Fruit and nut trail mix. °Eat balanced meals throughout the week. This includes: °Fruit: 2-3 servings a day. °Vegetables: 4-5 servings a day. °Low-fat dairy: 2 servings a day. °Fish, poultry, or lean meat: 1 serving a day. °Beans and legumes: 2 or more servings a week. °Nuts and seeds: 1-2 servings a day. °Whole grains: 6-8 servings a day. °Extra-virgin olive oil: 3-4 servings a day. °Limit red meat and sweets to only a few servings a month. °Lifestyle ° °Cook and eat meals together with your family, when possible. °Drink enough fluid to keep your urine pale yellow. °Be physically active every day. This includes: °Aerobic exercise like running or swimming. °Leisure activities like gardening, walking, or housework. °Get 7-8 hours of sleep each night. °If recommended by your health care provider, drink red wine in moderation. This means 1 glass a day for nonpregnant women and 2 glasses a day for men. A glass of wine equals 5 oz (150 mL). °What foods should I eat? °Fruits °Apples. Apricots. Avocado. Berries. Bananas. Cherries. Dates.   Figs. Grapes. Lemons. Melon. Oranges. Peaches. Plums. Pomegranate. °Vegetables °Artichokes. Beets. Broccoli. Cabbage. Carrots. Eggplant. Green beans. Chard. Kale. Spinach. Onions. Leeks. Peas. Squash. Tomatoes. Peppers. Radishes. °Grains °Whole-grain pasta. Brown  rice. Bulgur wheat. Polenta. Couscous. Whole-wheat bread. Oatmeal. Quinoa. °Meats and other proteins °Beans. Almonds. Sunflower seeds. Pine nuts. Peanuts. Cod. Salmon. Scallops. Shrimp. Tuna. Tilapia. Clams. Oysters. Eggs. Poultry without skin. °Dairy °Low-fat milk. Cheese. Greek yogurt. °Fats and oils °Extra-virgin olive oil. Avocado oil. Grapeseed oil. °Beverages °Water. Red wine. Herbal tea. °Sweets and desserts °Greek yogurt with honey. Baked apples. Poached pears. Trail mix. °Seasonings and condiments °Basil. Cilantro. Coriander. Cumin. Mint. Parsley. Sage. Rosemary. Tarragon. Garlic. Oregano. Thyme. Pepper. Balsamic vinegar. Tahini. Hummus. Tomato sauce. Olives. Mushrooms. °The items listed above may not be a complete list of foods and beverages you can eat. Contact a dietitian for more information. °What foods should I limit? °This is a list of foods that should be eaten rarely or only on special occasions. °Fruits °Fruit canned in syrup. °Vegetables °Deep-fried potatoes (french fries). °Grains °Prepackaged pasta or rice dishes. Prepackaged cereal with added sugar. Prepackaged snacks with added sugar. °Meats and other proteins °Beef. Pork. Lamb. Poultry with skin. Hot dogs. Bacon. °Dairy °Ice cream. Sour cream. Whole milk. °Fats and oils °Butter. Canola oil. Vegetable oil. Beef fat (tallow). Lard. °Beverages °Juice. Sugar-sweetened soft drinks. Beer. Liquor and spirits. °Sweets and desserts °Cookies. Cakes. Pies. Candy. °Seasonings and condiments °Mayonnaise. Pre-made sauces and marinades. °The items listed above may not be a complete list of foods and beverages you should limit. Contact a dietitian for more information. °Summary °The Mediterranean diet includes both food and lifestyle choices. °Eat a variety of fresh fruits and vegetables, beans, nuts, seeds, and whole grains. °Limit the amount of red meat and sweets that you eat. °If recommended by your health care provider, drink red wine in moderation.  This means 1 glass a day for nonpregnant women and 2 glasses a day for men. A glass of wine equals 5 oz (150 mL). °This information is not intended to replace advice given to you by your health care provider. Make sure you discuss any questions you have with your health care provider. °Document Revised: 04/06/2019 Document Reviewed: 02/01/2019 °Elsevier Patient Education © 2022 [employer] ° °

## 2021-06-17 NOTE — Progress Notes (Signed)
? ? ?Gynecology Annual Exam  ?PCP: Patient, No Pcp Per (Inactive) ? ?Chief Complaint:  ?Chief Complaint  ?Patient presents with  ? Gynecologic Exam  ?  No concerns  ? ? ?History of Present Illness: Patient is a 41 y.o. L2G4010 presents for annual exam. The patient has no complaints today.  ? ?LMP: Patient's last menstrual period was 05/14/2021 (exact date). ?Average Interval: regular, every 3 months ?Duration of flow:  5-7  days ?Heavy Menses: 1 day ?Clots: no ?Intermenstrual Bleeding: no ?Postcoital Bleeding: no ?Dysmenorrhea: no ? ? ?The patient is sexually active. She currently uses OCP (estrogen/progesterone) for contraception. She denies dyspareunia.  The patient does perform self breast exams.  There is no notable family history of breast or ovarian cancer in her family. ? ?The patient wears seatbelts: yes.   The patient has regular exercise:  she has limited exercise, she admits increased intake of sweets and fried foods, she has stopped drinking sodas, she admits adequate sleep .   ? ?The patient denies current symptoms of depression.   ? ?Review of Systems: Review of Systems  ?Constitutional:  Negative for chills and fever.  ?HENT:  Negative for congestion, ear discharge, ear pain, hearing loss, sinus pain and sore throat.   ?Eyes:  Negative for blurred vision and double vision.  ?Respiratory:  Negative for cough, shortness of breath and wheezing.   ?Cardiovascular:  Negative for chest pain, palpitations and leg swelling.  ?Gastrointestinal:  Negative for abdominal pain, blood in stool, constipation, diarrhea, heartburn, melena, nausea and vomiting.  ?Genitourinary:  Negative for dysuria, flank pain, frequency, hematuria and urgency.  ?Musculoskeletal:  Negative for back pain, joint pain and myalgias.  ?Skin:  Negative for itching and rash.  ?Neurological:  Negative for dizziness, tingling, tremors, sensory change, speech change, focal weakness, seizures, loss of consciousness, weakness and headaches.   ?Endo/Heme/Allergies:  Negative for environmental allergies. Does not bruise/bleed easily.  ?Psychiatric/Behavioral:  Negative for depression, hallucinations, memory loss, substance abuse and suicidal ideas. The patient is not nervous/anxious and does not have insomnia.   ? ?Past Medical History:  ?There are no problems to display for this patient. ? ? ?Past Surgical History:  ?Past Surgical History:  ?Procedure Laterality Date  ? GALLBLADDER SURGERY    ? MOUTH SURGERY    ? ? ?Gynecologic History:  ?Patient's last menstrual period was 05/14/2021 (exact date). ?Contraception: OCP (estrogen/progesterone) ?Last Pap: 1 year ago Results were:  NIL and HR HPV+ negative 16/18 ?Last mammogram: 2 years ago Results were: BI-RAD I ? ?Obstetric History: [id number] ? ?Family History:  ?Family History  ?Problem Relation Age of Onset  ? Diabetes Father   ? Breast cancer Maternal Grandmother 90  ? Colon cancer Maternal Grandfather 71  ? Ovarian cancer Paternal Grandmother   ?     possible dx  ? ? ?Social History:  ?Social History  ? ?Socioeconomic History  ? Marital status: Married  ?  Spouse name: Not on file  ? Number of children: Not on file  ? Years of education: Not on file  ? Highest education level: Not on file  ?Occupational History  ? Not on file  ?Tobacco Use  ? Smoking status: Never  ? Smokeless tobacco: Never  ?Substance and Sexual Activity  ? Alcohol use: Never  ? Drug use: Never  ? Sexual activity: Yes  ?  Birth control/protection: Pill  ?Other Topics Concern  ? Not on file  ?Social History Narrative  ? Not on file  ? ?Social  Determinants of Health  ? ?Financial Resource Strain: Not on file  ?Food Insecurity: Not on file  ?Transportation Needs: Not on file  ?Physical Activity: Not on file  ?Stress: Not on file  ?Social Connections: Not on file  ?Intimate Partner Violence: Not on file  ? ? ?Allergies:  ?No Known Allergies ? ?Medications: ?Prior to Admission medications   ?Medication Sig Start Date End Date Taking?  Authorizing Provider  ?desogestrel-ethinyl estradiol (APRI) 0.15-30 MG-MCG tablet TAKE 1 TABLET BY MOUTH EVERY DAY CONTINUOUSLY SKIPPING PLACEBO PILLS. STOPPING FOR ONLY 3 DAYS WHEN BLEED OR DESIRES BLEED 06/17/21   Tresea MallGledhill, Jair Lindblad, CNM  ? ? ?Physical Exam ?Vitals: Blood pressure 120/60, height 5\' 9"  (1.753 m), weight 234 lb (106.1 kg), last menstrual period 05/14/2021. ? ?General: NAD ?HEENT: normocephalic, anicteric ?Thyroid: no enlargement, no palpable nodules ?Pulmonary: No increased work of breathing, CTAB ?Cardiovascular: Irregular rhythm/occasional additional beat, distal pulses 2+ ?Breast: Breast symmetrical, no tenderness, no palpable nodules or masses, no skin or nipple retraction present, no nipple discharge.  No axillary or supraclavicular lymphadenopathy. ?Abdomen: NABS, soft, non-tender, non-distended.  Umbilicus without lesions.  No hepatomegaly, splenomegaly or masses palpable. No evidence of hernia  ?Genitourinary: ? External: Normal external female genitalia.  Normal urethral meatus, normal Bartholin's and Skene's glands.   ? Vagina: Normal vaginal mucosa, no evidence of prolapse.   ? Cervix: Grossly normal in appearance, friable ?Extremities: no edema, erythema, or tenderness ?Neurologic: Grossly intact ?Psychiatric: mood appropriate, affect full ? ? ?Assessment: 41 y.o. [id number] routine annual exam ? ?Plan: ?Problem List Items Addressed This Visit   ?None ?Visit Diagnoses   ? ? Well woman exam with routine gynecological exam    -  Primary  ? Relevant Orders  ? Cytology - PAP  ? Hgb A1c w/o eAG  ? TSH  ? Lipid Panel With LDL/HDL Ratio  ? CBC  ? Comprehensive metabolic panel  ? VITAMIN D 25 Hydroxy (Vit-D Deficiency, Fractures)  ? Screening for cervical cancer      ? Relevant Orders  ? Cytology - PAP  ? Need for lipid screening      ? Relevant Orders  ? Lipid Panel With LDL/HDL Ratio  ? Screening for diabetes mellitus      ? Relevant Orders  ? Hgb A1c w/o eAG  ? Screening for thyroid disorder      ?  Relevant Orders  ? TSH  ? Encounter for vitamin deficiency screening      ? Relevant Orders  ? VITAMIN D 25 Hydroxy (Vit-D Deficiency, Fractures)  ? Breast screening      ? Relevant Orders  ? MM 3D SCREEN BREAST BILATERAL  ? Encounter for surveillance of contraceptive pills      ? Relevant Medications  ? desogestrel-ethinyl estradiol (APRI) 0.15-30 MG-MCG tablet  ? ?  ? ? ?1) Mammogram - recommend yearly screening mammogram.  Mammogram Was ordered today ? ?2) STI screening  wasoffered and declined ? ?3) ASCCP guidelines and rationale discussed.  Patient opts for every 3 years screening interval following normal PAP ? ?4) Contraception - the patient is currently using  OCP (estrogen/progesterone).  She is happy with her current form of contraception and plans to continue ? ?5) Colonoscopy -- Screening recommended starting at age 42 for average risk individuals, age 42 for individuals deemed at increased risk (including African Americans) and recommended to continue until age 775.  For patient age 976-85 individualized approach is recommended.  Gold standard screening is via colonoscopy, Cologuard screening  is an acceptable alternative for patient unwilling or unable to undergo colonoscopy.  "Colorectal cancer screening for average?risk adults: 2018 guideline update from the American Cancer Society"CA: A Cancer Journal for Clinicians: Aug 11, 2016  ? ?6) Routine healthcare maintenance including cholesterol, diabetes screening discussed Ordered today ? ?7) Irregular heart rhythm: declines cardiology referral today, go to ER for severe symptoms, follow up with PCP as needed ? ?8) Increase healthy lifestyle; diet, exercise ? ?9) Return in about 1 year (around 06/18/2022) for annual established gyn. ? ? ?Tresea Mall, CNM ?[facility] ?[facility] ?06/17/2021, 2:11 PM ? ? ? ? ? ? ?  ?

## 2021-06-23 LAB — CYTOLOGY - PAP
Comment: NEGATIVE
Comment: NEGATIVE
Comment: NEGATIVE
Diagnosis: UNDETERMINED — AB
HPV 16: NEGATIVE
HPV 18 / 45: NEGATIVE
High risk HPV: POSITIVE — AB

## 2021-06-24 LAB — COMPREHENSIVE METABOLIC PANEL
ALT: 24 IU/L (ref 0–32)
AST: 21 IU/L (ref 0–40)
Albumin/Globulin Ratio: 1.6 (ref 1.2–2.2)
Albumin: 4.1 g/dL (ref 3.8–4.8)
Alkaline Phosphatase: 56 IU/L (ref 44–121)
BUN/Creatinine Ratio: 9 (ref 9–23)
BUN: 7 mg/dL (ref 6–24)
Bilirubin Total: 0.2 mg/dL (ref 0.0–1.2)
CO2: 24 mmol/L (ref 20–29)
Calcium: 9.3 mg/dL (ref 8.7–10.2)
Chloride: 104 mmol/L (ref 96–106)
Creatinine, Ser: 0.81 mg/dL (ref 0.57–1.00)
Globulin, Total: 2.6 g/dL (ref 1.5–4.5)
Glucose: 84 mg/dL (ref 70–99)
Potassium: 4.9 mmol/L (ref 3.5–5.2)
Sodium: 139 mmol/L (ref 134–144)
Total Protein: 6.7 g/dL (ref 6.0–8.5)
eGFR: 93 mL/min/{1.73_m2} (ref >59)

## 2021-06-24 LAB — LIPID PANEL WITH LDL/HDL RATIO
Cholesterol, Total: 136 mg/dL (ref 100–199)
HDL: 74 mg/dL (ref >39)
LDL Chol Calc (NIH): 46 mg/dL (ref 0–99)
LDL/HDL Ratio: 0.6 ratio (ref 0.0–3.2)
Triglycerides: 87 mg/dL (ref 0–149)
VLDL Cholesterol Cal: 16 mg/dL (ref 5–40)

## 2021-06-24 LAB — CBC
Hematocrit: 36.6 % (ref 34.0–46.6)
Hemoglobin: 11.8 g/dL (ref 11.1–15.9)
MCH: 30.3 pg (ref 26.6–33.0)
MCHC: 32.2 g/dL (ref 31.5–35.7)
MCV: 94 fL (ref 79–97)
Platelets: 398 10*3/uL (ref 150–450)
RBC: 3.9 x10E6/uL (ref 3.77–5.28)
RDW: 12.2 % (ref 11.7–15.4)
WBC: 4.9 10*3/uL (ref 3.4–10.8)

## 2021-06-24 LAB — VITAMIN D 25 HYDROXY (VIT D DEFICIENCY, FRACTURES): Vit D, 25-Hydroxy: 26.1 ng/mL — ABNORMAL LOW (ref 30.0–100.0)

## 2021-06-24 LAB — TSH: TSH: 0.856 u[IU]/mL (ref 0.450–4.500)

## 2021-06-24 LAB — HGB A1C W/O EAG: Hgb A1c MFr Bld: 4.9 % (ref 4.8–5.6)

## 2021-07-23 ENCOUNTER — Ambulatory Visit
Admission: RE | Admit: 2021-07-23 | Discharge: 2021-07-23 | Disposition: A | Discharge status: Home / Self Care | Payer: BC Managed Care – PPO | Source: Ambulatory Visit | Attending: Advanced Practice Midwife | Admitting: Advanced Practice Midwife

## 2021-07-23 DIAGNOSIS — Z1239 Encounter for other screening for malignant neoplasm of breast: Secondary | ICD-10-CM

## 2021-07-23 DIAGNOSIS — Z1231 Encounter for screening mammogram for malignant neoplasm of breast: Secondary | ICD-10-CM | POA: Insufficient documentation

## 2021-07-24 ENCOUNTER — Other Ambulatory Visit: Payer: Self-pay | Admitting: Advanced Practice Midwife

## 2021-07-24 DIAGNOSIS — N63 Unspecified lump in unspecified breast: Secondary | ICD-10-CM

## 2021-07-24 DIAGNOSIS — R928 Other abnormal and inconclusive findings on diagnostic imaging of breast: Secondary | ICD-10-CM

## 2021-08-05 ENCOUNTER — Ambulatory Visit
Admission: RE | Admit: 2021-08-05 | Discharge: 2021-08-05 | Disposition: A | Discharge status: Home / Self Care | Payer: BC Managed Care – PPO | Source: Ambulatory Visit | Attending: Advanced Practice Midwife | Admitting: Advanced Practice Midwife

## 2021-08-05 DIAGNOSIS — R928 Other abnormal and inconclusive findings on diagnostic imaging of breast: Secondary | ICD-10-CM

## 2021-08-05 DIAGNOSIS — N63 Unspecified lump in unspecified breast: Secondary | ICD-10-CM

## 2022-04-03 ENCOUNTER — Other Ambulatory Visit: Payer: Self-pay | Admitting: Advanced Practice Midwife

## 2022-04-03 DIAGNOSIS — Z3041 Encounter for surveillance of contraceptive pills: Secondary | ICD-10-CM

## 2022-05-02 ENCOUNTER — Other Ambulatory Visit: Payer: Self-pay | Admitting: Advanced Practice Midwife

## 2022-05-02 DIAGNOSIS — Z3041 Encounter for surveillance of contraceptive pills: Secondary | ICD-10-CM

## 2022-05-03 ENCOUNTER — Other Ambulatory Visit: Payer: Self-pay

## 2022-05-03 DIAGNOSIS — Z3041 Encounter for surveillance of contraceptive pills: Secondary | ICD-10-CM

## 2022-05-03 MED ORDER — DESOGESTREL-ETHINYL ESTRADIOL 0.15-30 MG-MCG PO TABS: ORAL_TABLET | ORAL | 1 refills | Status: DC

## 2022-07-22 ENCOUNTER — Ambulatory Visit (INDEPENDENT_AMBULATORY_CARE_PROVIDER_SITE_OTHER): Payer: BC Managed Care – PPO | Admitting: Advanced Practice Midwife

## 2022-07-22 ENCOUNTER — Other Ambulatory Visit (HOSPITAL_COMMUNITY)
Admission: RE | Admit: 2022-07-22 | Discharge: 2022-07-22 | Disposition: A | Discharge status: Home / Self Care | Payer: BC Managed Care – PPO | Source: Ambulatory Visit | Attending: Advanced Practice Midwife | Admitting: Advanced Practice Midwife

## 2022-07-22 ENCOUNTER — Encounter: Payer: Self-pay | Admitting: Advanced Practice Midwife

## 2022-07-22 VITALS — BP 117/78 | HR 70 | Ht 69.0 in | Wt 241.0 lb

## 2022-07-22 DIAGNOSIS — Z01419 Encounter for gynecological examination (general) (routine) without abnormal findings: Secondary | ICD-10-CM | POA: Diagnosis not present

## 2022-07-22 DIAGNOSIS — Z124 Encounter for screening for malignant neoplasm of cervix: Secondary | ICD-10-CM | POA: Insufficient documentation

## 2022-07-22 DIAGNOSIS — Z3041 Encounter for surveillance of contraceptive pills: Secondary | ICD-10-CM

## 2022-07-22 DIAGNOSIS — Z131 Encounter for screening for diabetes mellitus: Secondary | ICD-10-CM

## 2022-07-22 DIAGNOSIS — Z1231 Encounter for screening mammogram for malignant neoplasm of breast: Secondary | ICD-10-CM

## 2022-07-22 DIAGNOSIS — Z13220 Encounter for screening for lipoid disorders: Secondary | ICD-10-CM

## 2022-07-22 MED ORDER — DESOGESTREL-ETHINYL ESTRADIOL 0.15-30 MG-MCG PO TABS: ORAL_TABLET | ORAL | 4 refills | Status: DC

## 2022-07-22 NOTE — Progress Notes (Signed)
Gynecology Annual Exam  PCP: Patient, No Pcp Per  Chief Complaint:  Chief Complaint  Patient presents with   Annual Exam    History of Present Illness: Patient is a 42 y.o. [id number] presents for annual exam. The patient has no complaints today.   LMP: Patient's last menstrual period was 05/22/2022. Average Interval: regular, every 3 months Duration of flow: 5 days Heavy Menses: 1 or 2 days heavy Clots: no Intermenstrual Bleeding: no Postcoital Bleeding: no Dysmenorrhea: no   The patient is sexually active. She currently uses OCP (estrogen/progesterone) for contraception. She denies dyspareunia.  The patient does perform self breast exams.  There is no notable family history of breast or ovarian cancer in her family.  The patient wears seatbelts: yes.   The patient has regular exercise:  yes .    The patient denies current symptoms of depression.    Review of Systems: Review of Systems  Constitutional:  Negative for chills and fever.  HENT:  Negative for congestion, ear discharge, ear pain, hearing loss, sinus pain and sore throat.   Eyes:  Negative for blurred vision and double vision.  Respiratory:  Negative for cough, shortness of breath and wheezing.   Cardiovascular:  Negative for chest pain, palpitations and leg swelling.  Gastrointestinal:  Negative for abdominal pain, blood in stool, constipation, diarrhea, heartburn, melena, nausea and vomiting.  Genitourinary:  Negative for dysuria, flank pain, frequency, hematuria and urgency.  Musculoskeletal:  Negative for back pain, joint pain and myalgias.  Skin:  Negative for itching and rash.  Neurological:  Negative for dizziness, tingling, tremors, sensory change, speech change, focal weakness, seizures, loss of consciousness, weakness and headaches.  Endo/Heme/Allergies:  Negative for environmental allergies. Does not bruise/bleed easily.  Psychiatric/Behavioral:  Negative for depression, hallucinations, memory loss,  substance abuse and suicidal ideas. The patient is not nervous/anxious and does not have insomnia.     Past Medical History:  Patient Active Problem List   Diagnosis Date Noted   Obesity (BMI 30-39.9) 11/14/2015    Past Surgical History:  Past Surgical History:  Procedure Laterality Date   GALLBLADDER SURGERY     MOUTH SURGERY      Gynecologic History:  Patient's last menstrual period was 05/22/2022.  Last Pap: 2023 Results were:  ASCUS with POSITIVE high risk HPV  Last mammogram: May 2023 Results were: BI-RAD II Benign  Obstetric History: [id number]  Family History:  Family History  Problem Relation Age of Onset   Diabetes Father    Breast cancer Maternal Grandmother 46   Colon cancer Maternal Grandfather 6   Ovarian cancer Paternal Grandmother        possible dx    Social History:  Social History   Socioeconomic History   Marital status: Married    Spouse name: Not on file   Number of children: Not on file   Years of education: Not on file   Highest education level: Not on file  Occupational History   Not on file  Tobacco Use   Smoking status: Never   Smokeless tobacco: Never  Substance and Sexual Activity   Alcohol use: Never   Drug use: Never   Sexual activity: Yes    Birth control/protection: Pill  Other Topics Concern   Not on file  Social History Narrative   Not on file   Social Determinants of Health   Financial Resource Strain: Not on file  Food Insecurity: Not on file  Transportation Needs: Not on file  Physical Activity:  Not on file  Stress: Not on file  Social Connections: Not on file  Intimate Partner Violence: Not on file    Allergies:  No Known Allergies  Medications: Prior to Admission medications   Medication Sig Start Date End Date Taking? Authorizing Provider  amoxicillin (AMOXIL) 875 MG tablet Take 875 mg by mouth 2 (two) times daily. 05/12/22  Yes [provider]  desogestrel-ethinyl estradiol (APRI) 0.15-30 MG-MCG  tablet TAKE 1 TABLET BY MOUTH EVERY DAY CONTINUOUSLY SKIPPING PLACEBO PILLS. STOPPING FOR ONLY 3 DAYS WHEN BLEED OR DESIRES BLEED 05/03/22  Yes Tresea Mall, CNM    Physical Exam Vitals: Blood pressure 117/78, pulse 70, height 5\' 9"  (1.753 m), weight 241 lb (109.3 kg), last menstrual period 05/22/2022.  General: NAD HEENT: normocephalic, anicteric Thyroid: no enlargement, no palpable nodules Pulmonary: No increased work of breathing, CTAB Cardiovascular: RRR, distal pulses 2+ Breast: Breast symmetrical, no tenderness, no palpable nodules or masses, no skin or nipple retraction present, no nipple discharge.  No axillary or supraclavicular lymphadenopathy. Abdomen: NABS, soft, non-tender, non-distended.  Umbilicus without lesions.  No hepatomegaly, splenomegaly or masses palpable. No evidence of hernia  Genitourinary:  External: Normal external female genitalia.  Normal urethral meatus, normal Bartholin's and Skene's glands.    Vagina: Normal vaginal mucosa, no evidence of prolapse.    Cervix: Grossly normal in appearance, no bleeding  Uterus: Non-enlarged, mobile, normal contour.  No CMT  Adnexa: ovaries non-enlarged, no adnexal masses  Rectal: deferred  Lymphatic: no evidence of inguinal lymphadenopathy Extremities: no edema, erythema, or tenderness Neurologic: Grossly intact Psychiatric: mood appropriate, affect full   Assessment: 42 y.o. [id number] routine annual exam  Plan: Problem List Items Addressed This Visit   None Visit Diagnoses     Well woman exam with routine gynecological exam    -  Primary   Relevant Medications   desogestrel-ethinyl estradiol (APRI) 0.15-30 MG-MCG tablet   Other Relevant Orders   Cytology - PAP   CBC with Differential/Platelet   Hgb A1c w/o eAG   Lipid Panel With LDL/HDL Ratio   Comprehensive metabolic panel   TSH   Screening for cervical cancer       Relevant Orders   Cytology - PAP   Screening for diabetes mellitus       Relevant Orders    Hgb A1c w/o eAG   Lipid screening       Relevant Orders   Lipid Panel With LDL/HDL Ratio   Screening mammogram for breast cancer       Relevant Orders   MM 3D SCREENING MAMMOGRAM BILATERAL BREAST   Encounter for surveillance of contraceptive pills       Relevant Medications   desogestrel-ethinyl estradiol (APRI) 0.15-30 MG-MCG tablet       1) Mammogram - recommend yearly screening mammogram.  Mammogram Was ordered today   2) STI screening  was offered and declined  3) ASCCP guidelines and rationale discussed.  Patient opts for every 3 years screening interval following normal result  4) Contraception - the patient is currently using  OCP (estrogen/progesterone).  She is happy with her current form of contraception and plans to continue  5) Colonoscopy -- Screening recommended starting at age 42 for average risk individuals, age 33 for individuals deemed at increased risk (including African Americans) and recommended to continue until age 55.  For patient age 12-85 individualized approach is recommended.  Gold standard screening is via colonoscopy, Cologuard screening is an acceptable alternative for patient unwilling or unable to  undergo colonoscopy.  "Colorectal cancer screening for average?risk adults: 2018 guideline update from the American Cancer Society"CA: A Cancer Journal for Clinicians: Aug 11, 2016   6) Routine healthcare maintenance including cholesterol, diabetes screening discussed Ordered today  7) Return in about 1 year (around 07/22/2023) for annual established gyn.   Tresea Mall, CNM [facility] 07/22/2022 3:01 PM

## 2022-07-23 LAB — COMPREHENSIVE METABOLIC PANEL
ALT: 10 IU/L (ref 0–32)
AST: 12 IU/L (ref 0–40)
Albumin/Globulin Ratio: 1.5 (ref 1.2–2.2)
Albumin: 3.9 g/dL (ref 3.9–4.9)
Alkaline Phosphatase: 54 IU/L (ref 44–121)
BUN/Creatinine Ratio: 13 (ref 9–23)
BUN: 9 mg/dL (ref 6–24)
Bilirubin Total: <0.2 mg/dL (ref 0.0–1.2)
CO2: 22 mmol/L (ref 20–29)
Calcium: 8.6 mg/dL — ABNORMAL LOW (ref 8.7–10.2)
Chloride: 102 mmol/L (ref 96–106)
Creatinine, Ser: 0.71 mg/dL (ref 0.57–1.00)
Globulin, Total: 2.6 g/dL (ref 1.5–4.5)
Glucose: 80 mg/dL (ref 70–99)
Potassium: 4.2 mmol/L (ref 3.5–5.2)
Sodium: 139 mmol/L (ref 134–144)
Total Protein: 6.5 g/dL (ref 6.0–8.5)
eGFR: 109 mL/min/{1.73_m2} (ref >59)

## 2022-07-23 LAB — CBC WITH DIFFERENTIAL/PLATELET
Basophils Absolute: 0.1 10*3/uL (ref 0.0–0.2)
Basos: 1 %
EOS (ABSOLUTE): 0.2 10*3/uL (ref 0.0–0.4)
Eos: 3 %
Hematocrit: 35.6 % (ref 34.0–46.6)
Hemoglobin: 11.5 g/dL (ref 11.1–15.9)
Immature Grans (Abs): 0 10*3/uL (ref 0.0–0.1)
Immature Granulocytes: 0 %
Lymphocytes Absolute: 2 10*3/uL (ref 0.7–3.1)
Lymphs: 37 %
MCH: 29.9 pg (ref 26.6–33.0)
MCHC: 32.3 g/dL (ref 31.5–35.7)
MCV: 93 fL (ref 79–97)
Monocytes Absolute: 0.5 10*3/uL (ref 0.1–0.9)
Monocytes: 9 %
Neutrophils Absolute: 2.7 10*3/uL (ref 1.4–7.0)
Neutrophils: 50 %
Platelets: 375 10*3/uL (ref 150–450)
RBC: 3.85 x10E6/uL (ref 3.77–5.28)
RDW: 12.3 % (ref 11.7–15.4)
WBC: 5.4 10*3/uL (ref 3.4–10.8)

## 2022-07-23 LAB — LIPID PANEL WITH LDL/HDL RATIO
Cholesterol, Total: 136 mg/dL (ref 100–199)
HDL: 75 mg/dL (ref >39)
LDL Chol Calc (NIH): 45 mg/dL (ref 0–99)
LDL/HDL Ratio: 0.6 ratio (ref 0.0–3.2)
Triglycerides: 87 mg/dL (ref 0–149)
VLDL Cholesterol Cal: 16 mg/dL (ref 5–40)

## 2022-07-23 LAB — HGB A1C W/O EAG: Hgb A1c MFr Bld: 5.2 % (ref 4.8–5.6)

## 2022-07-23 LAB — TSH: TSH: 1.44 u[IU]/mL (ref 0.450–4.500)

## 2022-07-29 LAB — CYTOLOGY - PAP
Comment: NEGATIVE
Comment: NEGATIVE
Comment: NEGATIVE
Diagnosis: UNDETERMINED — AB
HPV 16: NEGATIVE
HPV 18 / 45: NEGATIVE
High risk HPV: POSITIVE — AB

## 2022-08-31 ENCOUNTER — Ambulatory Visit
Admission: RE | Admit: 2022-08-31 | Discharge: 2022-08-31 | Disposition: A | Discharge status: Home / Self Care | Payer: BC Managed Care – PPO | Source: Ambulatory Visit | Attending: Advanced Practice Midwife | Admitting: Advanced Practice Midwife

## 2022-08-31 DIAGNOSIS — Z1231 Encounter for screening mammogram for malignant neoplasm of breast: Secondary | ICD-10-CM | POA: Insufficient documentation

## 2022-09-15 ENCOUNTER — Ambulatory Visit: Payer: BC Managed Care – PPO | Admitting: Obstetrics and Gynecology

## 2022-10-05 ENCOUNTER — Encounter: Payer: Self-pay | Admitting: Obstetrics and Gynecology

## 2022-10-05 ENCOUNTER — Ambulatory Visit (INDEPENDENT_AMBULATORY_CARE_PROVIDER_SITE_OTHER): Payer: BC Managed Care – PPO | Admitting: Obstetrics and Gynecology

## 2022-10-05 ENCOUNTER — Other Ambulatory Visit (HOSPITAL_COMMUNITY)
Admission: RE | Admit: 2022-10-05 | Discharge: 2022-10-05 | Disposition: A | Discharge status: Home / Self Care | Payer: BC Managed Care – PPO | Source: Ambulatory Visit | Attending: Obstetrics and Gynecology | Admitting: Obstetrics and Gynecology

## 2022-10-05 VITALS — BP 113/74 | HR 61 | Ht 69.0 in | Wt 245.9 lb

## 2022-10-05 DIAGNOSIS — R87610 Atypical squamous cells of undetermined significance on cytologic smear of cervix (ASC-US): Secondary | ICD-10-CM

## 2022-10-05 DIAGNOSIS — R87810 Cervical high risk human papillomavirus (HPV) DNA test positive: Secondary | ICD-10-CM

## 2022-10-05 DIAGNOSIS — N72 Inflammatory disease of cervix uteri: Secondary | ICD-10-CM

## 2022-10-05 NOTE — Progress Notes (Signed)
Patient presents today for a colposcopy. She recently had an abnormal pap smear resulting in ASCUS/HPV+. No further concerns today. 

## 2022-10-05 NOTE — Progress Notes (Signed)
Referring Provider:  [facility]  HPI:  Julie Conrad is a 42 y.o.  [id number]  who presents today for evaluation and management of abnormal cervical cytology.    Dysplasia History:  ASCUS     HPV:  Positive  ROS:  Pertinent items noted in HPI and remainder of comprehensive ROS otherwise negative.  OB History  Gravida Para Term Preterm AB Living  3 2 2   1 2   SAB IAB Ectopic Multiple Live Births               # Outcome Date GA Lbr Len/2nd Weight Sex Type Anes PTL Lv  3 AB           2 Term           1 Term             Past Medical History:  Diagnosis Date   No pertinent past medical history     Past Surgical History:  Procedure Laterality Date   GALLBLADDER SURGERY     MOUTH SURGERY      SOCIAL HISTORY:  Social History   Substance and Sexual Activity  Alcohol Use Never    Social History   Substance and Sexual Activity  Drug Use Never     Family History  Problem Relation Age of Onset   Diabetes Father    Breast cancer Maternal Grandmother 71   Colon cancer Maternal Grandfather 40   Ovarian cancer Paternal Grandmother        possible dx    ALLERGIES:  Patient has no known allergies.  She has a current medication list which includes the following prescription(s): vitamin d-1000 max st, cyanocobalamin, and desogestrel-ethinyl estradiol.  Physical Exam: -Vitals:  BP 113/74   Pulse 61   Ht 5\' 9"  (1.753 m)   Wt 245 lb 14.4 oz (111.5 kg)   LMP 08/23/2022   BMI 36.31 kg/m   PROCEDURE: Colposcopy performed with 4% acetic acid after verbal consent obtained                           -Aceto-white Lesions Location(s): See above              -Biopsy performed at 6 and 10 o'clock               -ECC indicated and performed: No.     -Biopsy sites made hemostatic with pressure and Monsel's solution   -Satisfactory colposcopy: Yes.      -Evidence of Invasive cervical CA :  NO  ASSESSMENT:  Julie Conrad is a 42 y.o. [id number] here for  1. ASCUS with positive high risk  HPV cervical   .  PLAN: 1.  I discussed the grading system of pap smears and HPV high risk viral types.  We will discuss management after colpo results return.  No orders of the defined types were placed in this encounter.          F/U  Return in about 2 weeks (around 10/19/2022).  Brennan Bailey ,MD 10/05/2022,3:02 PM

## 2022-10-07 LAB — SURGICAL PATHOLOGY

## 2022-10-28 ENCOUNTER — Telehealth (INDEPENDENT_AMBULATORY_CARE_PROVIDER_SITE_OTHER): Payer: BC Managed Care – PPO | Admitting: Obstetrics and Gynecology

## 2022-10-28 ENCOUNTER — Encounter: Payer: Self-pay | Admitting: Obstetrics and Gynecology

## 2022-10-28 DIAGNOSIS — R87810 Cervical high risk human papillomavirus (HPV) DNA test positive: Secondary | ICD-10-CM

## 2022-10-28 DIAGNOSIS — R87610 Atypical squamous cells of undetermined significance on cytologic smear of cervix (ASC-US): Secondary | ICD-10-CM

## 2022-10-28 NOTE — Progress Notes (Signed)
Virtual Visit via Video Note  I connected with Scherrie November on 10/28/22 at  7:55 AM EDT by video and verified that I was speaking with the correct person using two identifiers.    Ms. Mckayla Luetkemeyer is a 42 y.o. [id number] who LMP was Patient's last menstrual period was 08/23/2022. I discussed the limitations, risks, security and privacy concerns of performing an evaluation and management service by video and the availability of in person appointments. I also discussed with the patient that there may be a patient responsible charge related to this service. The patient expressed understanding and agreed to proceed.  Location of patient: Home  Patient gave explicit verbal consent for video visit:  YES  Location of provider:  [facility] office  Persons other than physician and patient involved in provider conference:  None   Subjective:   History of Present Illness:    Patient had an abnormal Pap smear with positive HPV.  She underwent colposcopy.  She presents today via video visit for further discussion regarding her colposcopy results.  Hx: The following portions of the patient's history were reviewed and updated as appropriate:             She  has a past medical history of No pertinent past medical history. She does not have any pertinent problems on file. She  has a past surgical history that includes Gallbladder surgery and Mouth surgery. Her family history includes Breast cancer (age of onset: 61) in her maternal grandmother; Colon cancer (age of onset: 45) in her maternal grandfather; Diabetes in her father; Ovarian cancer in her paternal grandmother. She  reports that she has never smoked. She has never used smokeless tobacco. She reports that she does not drink alcohol and does not use drugs. She has a current medication list which includes the following prescription(s): vitamin d-1000 max st, cyanocobalamin, and desogestrel-ethinyl estradiol. She has No Known Allergies.       Review of  Systems:  Review of Systems  Constitutional: Denied constitutional symptoms, night sweats, recent illness, fatigue, fever, insomnia and weight loss.  Eyes: Denied eye symptoms, eye pain, photophobia, vision change and visual disturbance.  Ears/Nose/Throat/Neck: Denied ear, nose, throat or neck symptoms, hearing loss, nasal discharge, sinus congestion and sore throat.  Cardiovascular: Denied cardiovascular symptoms, arrhythmia, chest pain/pressure, edema, exercise intolerance, orthopnea and palpitations.  Respiratory: Denied pulmonary symptoms, asthma, pleuritic pain, productive sputum, cough, dyspnea and wheezing.  Gastrointestinal: Denied, gastro-esophageal reflux, melena, nausea and vomiting.  Genitourinary: Denied genitourinary symptoms including symptomatic vaginal discharge, pelvic relaxation issues, and urinary complaints.  Musculoskeletal: Denied musculoskeletal symptoms, stiffness, swelling, muscle weakness and myalgia.  Dermatologic: Denied dermatology symptoms, rash and scar.  Neurologic: Denied neurology symptoms, dizziness, headache, neck pain and syncope.  Psychiatric: Denied psychiatric symptoms, anxiety and depression.  Endocrine: Denied endocrine symptoms including hot flashes and night sweats.   Meds:   Current Outpatient Medications on File Prior to Visit  Medication Sig Dispense Refill   Cholecalciferol (VITAMIN D-1000 MAX ST) 25 MCG (1000 UT) tablet Take by mouth.     cyanocobalamin (VITAMIN B12) 1000 MCG tablet Take by mouth.     desogestrel-ethinyl estradiol (APRI) 0.15-30 MG-MCG tablet TAKE 1 TABLET BY MOUTH EVERY DAY CONTINUOUSLY SKIPPING PLACEBO PILLS. STOPPING FOR ONLY 3 DAYS WHEN BLEED OR DESIRES BLEED 84 tablet 4   No current facility-administered medications on file prior to visit.    Assessment:    [id number] Patient Active Problem List   Diagnosis Date Noted   Obesity (BMI  30-39.9) 11/14/2015     1. ASCUS with positive high risk HPV cervical     Her  colposcopy results are consistent with "HPV effect".  Likely close but possibly not rising to the level of CIN-1.  Plan:            1.  Per ASCCP guidelines we will treat this is if the diagnosis of CIN-1 has been made.  I have recommended a follow-up Pap smear in 1 year.  We will base the future testing on that Pap smear. I have discussed the natural course and history of HPV and its effect on cervical dysplasia and subsequent cervical cancer with the patient.  All of her questions were answered. Orders No orders of the defined types were placed in this encounter.   No orders of the defined types were placed in this encounter.     F/U  Return in about 1 year (around 10/28/2023).   Elonda Husky, M.D. 10/28/2022 8:03 AM

## 2023-07-25 ENCOUNTER — Other Ambulatory Visit (HOSPITAL_COMMUNITY)
Admission: RE | Admit: 2023-07-25 | Discharge: 2023-07-25 | Disposition: A | Discharge status: Home / Self Care | Source: Ambulatory Visit | Attending: Advanced Practice Midwife | Admitting: Advanced Practice Midwife

## 2023-07-25 ENCOUNTER — Encounter: Payer: Self-pay | Admitting: Advanced Practice Midwife

## 2023-07-25 ENCOUNTER — Ambulatory Visit (INDEPENDENT_AMBULATORY_CARE_PROVIDER_SITE_OTHER): Payer: Self-pay | Admitting: Advanced Practice Midwife

## 2023-07-25 VITALS — BP 119/65 | HR 64 | Ht 69.0 in | Wt 243.3 lb

## 2023-07-25 DIAGNOSIS — Z1321 Encounter for screening for nutritional disorder: Secondary | ICD-10-CM

## 2023-07-25 DIAGNOSIS — Z13220 Encounter for screening for lipoid disorders: Secondary | ICD-10-CM

## 2023-07-25 DIAGNOSIS — Z1239 Encounter for other screening for malignant neoplasm of breast: Secondary | ICD-10-CM

## 2023-07-25 DIAGNOSIS — Z124 Encounter for screening for malignant neoplasm of cervix: Secondary | ICD-10-CM | POA: Diagnosis present

## 2023-07-25 DIAGNOSIS — Z8742 Personal history of other diseases of the female genital tract: Secondary | ICD-10-CM | POA: Diagnosis present

## 2023-07-25 DIAGNOSIS — Z3041 Encounter for surveillance of contraceptive pills: Secondary | ICD-10-CM

## 2023-07-25 DIAGNOSIS — Z01419 Encounter for gynecological examination (general) (routine) without abnormal findings: Secondary | ICD-10-CM | POA: Insufficient documentation

## 2023-07-25 DIAGNOSIS — Z131 Encounter for screening for diabetes mellitus: Secondary | ICD-10-CM

## 2023-07-25 MED ORDER — APRI 0.15-30 MG-MCG PO TABS
ORAL_TABLET | ORAL | 4 refills | Status: AC
Start: 2023-07-25 — End: ?

## 2023-07-25 NOTE — Progress Notes (Signed)
 Gynecology Annual Exam  PCP: Patient, No Pcp Per  Chief Complaint:  Chief Complaint  Patient presents with   Gynecologic Exam    History of Present Illness: Patient is a 43 y.o. [id number] presents for annual exam. The patient has no gyn complaints today. She has recently been seen for hearing loss. Has follow up with ENT.  LMP: Patient's last menstrual period was 07/11/2023 (exact date). Average Interval: regular, every 3 months Duration of flow: 5 days Heavy Menses: no Clots: no Intermenstrual Bleeding: no Postcoital Bleeding: no Dysmenorrhea: no   The patient is sexually active. She currently uses OCP (estrogen/progesterone) for contraception. She denies dyspareunia.  The patient does perform self breast exams.  There is no notable family history of breast or ovarian cancer in her family.  The patient wears seatbelts: yes.   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    The patient denies current symptoms of depression.    Review of Systems: Review of Systems  Constitutional:  Negative for chills and fever.  HENT:  Positive for hearing loss. Negative for congestion, ear discharge, ear pain, sinus pain and sore throat.   Eyes:  Negative for blurred vision and double vision.  Respiratory:  Negative for cough, shortness of breath and wheezing.   Cardiovascular:  Negative for chest pain, palpitations and leg swelling.  Gastrointestinal:  Negative for abdominal pain, blood in stool, constipation, diarrhea, heartburn, melena, nausea and vomiting.  Genitourinary:  Negative for dysuria, flank pain, frequency, hematuria and urgency.  Musculoskeletal:  Negative for back pain, joint pain and myalgias.  Skin:  Negative for itching and rash.  Neurological:  Negative for dizziness, tingling, tremors, sensory change, speech change, focal weakness,  seizures, loss of consciousness, weakness and headaches.  Endo/Heme/Allergies:  Negative for environmental allergies. Does not bruise/bleed easily.  Psychiatric/Behavioral:  Negative for depression, hallucinations, memory loss, substance abuse and suicidal ideas. The patient is not nervous/anxious and does not have insomnia.     Past Medical History:  Patient Active Problem List   Diagnosis Date Noted Date Diagnosed   Obesity (BMI 30-39.9) 11/14/2015     Past Surgical History:  Past Surgical History:  Procedure Laterality Date   GALLBLADDER SURGERY     MOUTH SURGERY      Gynecologic History:  Patient's last menstrual period was 07/11/2023 (exact date).  Last Pap: 2024 Results were:  ASCUS with POSITIVE high risk HPV  Last mammogram: 2024 Results were: BI-RAD I  Obstetric History: [id number]  Family History:  Family History  Problem Relation Age of Onset   Diabetes Father    Breast cancer Maternal Grandmother 21   Colon cancer Maternal Grandfather 35   Ovarian cancer Paternal Grandmother        possible dx    Social History:  Social History   Socioeconomic History   Marital status: Married    Spouse name: Not on file   Number of children: Not on file   Years of education: Not on file   Highest education level: Not on file  Occupational History   Not on file  Tobacco Use   Smoking status: Never   Smokeless tobacco: Never  Substance and Sexual Activity   Alcohol use: Never   Drug use: Never   Sexual activity: Yes    Birth control/protection: Pill  Other Topics Concern   Not on file  Social History Narrative   Not on file   Social Drivers of Health   Financial Resource Strain: Not on file  Food Insecurity: Not on file  Transportation Needs: Not on file  Physical Activity: Not on file  Stress: Not on file  Social Connections: Not on file  Intimate Partner Violence: Not on file    Allergies:  No Known Allergies  Medications: Prior to Admission  medications   Medication Sig Start Date End Date Taking? Authorizing Provider  Cholecalciferol (VITAMIN D-1000 MAX ST) 25 MCG (1000 UT) tablet Take by mouth.   Yes [provider]  cyanocobalamin (VITAMIN B12) 1000 MCG tablet Take by mouth.   Yes [provider]  desogestrel -ethinyl estradiol  (APRI ) 0.15-30 MG-MCG tablet TAKE 1 TABLET BY MOUTH EVERY DAY CONTINUOUSLY SKIPPING PLACEBO PILLS. STOPPING FOR ONLY 3 DAYS WHEN BLEED OR DESIRES BLEED 07/22/22  Yes Angelita Kendall, CNM    Physical Exam Vitals: Blood pressure 119/65, pulse 64, height 5\' 9"  (1.753 m), weight 243 lb 4.8 oz (110.4 kg), last menstrual period 07/11/2023.  General: NAD HEENT: normocephalic, anicteric Thyroid: no enlargement, no palpable nodules Pulmonary: No increased work of breathing, CTAB Cardiovascular: RRR, distal pulses 2+ Breast: Breast symmetrical, no tenderness, no palpable nodules or masses, no skin or nipple retraction present, no nipple discharge.  No axillary or supraclavicular lymphadenopathy. Abdomen: NABS, soft, non-tender, non-distended.  Umbilicus without lesions.  No hepatomegaly, splenomegaly or masses palpable. No evidence of hernia  Genitourinary:  External: Normal external female genitalia.  Normal urethral meatus, normal Bartholin's and Skene's glands.    Vagina: Normal vaginal mucosa, no evidence of prolapse.    Cervix: Grossly normal in appearance, no bleeding  Uterus: Non-enlarged, mobile, normal contour.  No CMT  Adnexa: ovaries non-enlarged, no adnexal masses  Rectal: deferred  Lymphatic: no evidence of inguinal lymphadenopathy Extremities: no edema, erythema, or tenderness Neurologic: Grossly intact Psychiatric: mood appropriate, affect full   Assessment: 43 y.o. [id number] routine annual exam  Plan: Problem List Items Addressed This Visit   None Visit Diagnoses       Well woman exam with routine gynecological exam    -  Primary   Relevant Medications    desogestrel -ethinyl estradiol  (APRI ) 0.15-30 MG-MCG tablet   Other Relevant Orders   Cytology - PAP   CBC with Differential/Platelet   Lipid Panel With LDL/HDL Ratio   Hgb A1c w/o eAG   Comprehensive metabolic panel with GFR   TSH   VITAMIN D 25 Hydroxy (Vit-D Deficiency, Fractures)   MM 3D SCREENING MAMMOGRAM BILATERAL BREAST     History of abnormal cervical Pap smear       Relevant Orders   Cytology - PAP     Cervical cancer screening       Relevant Orders   Cytology - PAP     Lipid screening       Relevant Orders   Lipid Panel With LDL/HDL Ratio     Encounter for vitamin deficiency screening       Relevant Orders   VITAMIN D 25 Hydroxy (Vit-D Deficiency, Fractures)     Screening for diabetes mellitus       Relevant Orders   Hgb A1c w/o eAG     Breast screening       Relevant Orders   MM 3D SCREENING MAMMOGRAM BILATERAL BREAST     Encounter for surveillance of contraceptive pills       Relevant Medications   desogestrel -ethinyl estradiol  (APRI ) 0.15-30 MG-MCG tablet  1) Mammogram - recommend yearly screening mammogram.  Mammogram Was ordered today  2) STI screening  was offered and declined  3) ASCCP guidelines and rationale discussed.  Patient opts for yearly screening interval until normal screen and then every 3 years  4) Contraception - the patient is currently using  OCP (estrogen/progesterone).  She is happy with her current form of contraception and plans to continue  5) Colonoscopy -- Screening recommended starting at age 50 for average risk individuals, age 76 for individuals deemed at increased risk (including African Americans) and recommended to continue until age 75.  For patient age 35-85 individualized approach is recommended.  Gold standard screening is via colonoscopy, Cologuard screening is an acceptable alternative for patient unwilling or unable to undergo colonoscopy.  "Colorectal cancer screening for average?risk adults: 2018 guideline update  from the American Cancer Society"[place]: A Cancer Journal for Clinicians: Aug 11, 2016   6) Routine healthcare maintenance including cholesterol, diabetes screening discussed Ordered today  7) Return in about 1 year (around 07/24/2024) for annual established gyn.   Angelita Kendall, CNM  [facility] 07/25/2023 4:15 PM

## 2023-07-25 NOTE — Patient Instructions (Signed)

## 2023-07-26 LAB — COMPREHENSIVE METABOLIC PANEL WITH GFR
ALT: 18 IU/L (ref 0–32)
AST: 18 IU/L (ref 0–40)
Albumin: 4.1 g/dL (ref 3.9–4.9)
Alkaline Phosphatase: 60 IU/L (ref 44–121)
BUN/Creatinine Ratio: 14 (ref 9–23)
BUN: 12 mg/dL (ref 6–24)
Bilirubin Total: <0.2 mg/dL (ref 0.0–1.2)
CO2: 23 mmol/L (ref 20–29)
Calcium: 9.2 mg/dL (ref 8.7–10.2)
Chloride: 100 mmol/L (ref 96–106)
Creatinine, Ser: 0.86 mg/dL (ref 0.57–1.00)
Globulin, Total: 2.7 g/dL (ref 1.5–4.5)
Glucose: 84 mg/dL (ref 70–99)
Potassium: 4.8 mmol/L (ref 3.5–5.2)
Sodium: 137 mmol/L (ref 134–144)
Total Protein: 6.8 g/dL (ref 6.0–8.5)
eGFR: 86 mL/min/{1.73_m2} (ref >59)

## 2023-07-26 LAB — HGB A1C W/O EAG: Hgb A1c MFr Bld: 5.2 % (ref 4.8–5.6)

## 2023-07-26 LAB — CBC WITH DIFFERENTIAL/PLATELET
Basophils Absolute: 0.1 10*3/uL (ref 0.0–0.2)
Basos: 1 %
EOS (ABSOLUTE): 0 10*3/uL (ref 0.0–0.4)
Eos: 0 %
Hematocrit: 38.2 % (ref 34.0–46.6)
Hemoglobin: 12.1 g/dL (ref 11.1–15.9)
Immature Grans (Abs): 0 10*3/uL (ref 0.0–0.1)
Immature Granulocytes: 0 %
Lymphocytes Absolute: 2 10*3/uL (ref 0.7–3.1)
Lymphs: 23 %
MCH: 30.6 pg (ref 26.6–33.0)
MCHC: 31.7 g/dL (ref 31.5–35.7)
MCV: 97 fL (ref 79–97)
Monocytes Absolute: 0.4 10*3/uL (ref 0.1–0.9)
Monocytes: 5 %
Neutrophils Absolute: 6 10*3/uL (ref 1.4–7.0)
Neutrophils: 71 %
Platelets: 455 10*3/uL — ABNORMAL HIGH (ref 150–450)
RBC: 3.96 x10E6/uL (ref 3.77–5.28)
RDW: 12.2 % (ref 11.7–15.4)
WBC: 8.5 10*3/uL (ref 3.4–10.8)

## 2023-07-26 LAB — LIPID PANEL WITH LDL/HDL RATIO
Cholesterol, Total: 152 mg/dL (ref 100–199)
HDL: 88 mg/dL (ref >39)
LDL Chol Calc (NIH): 48 mg/dL (ref 0–99)
LDL/HDL Ratio: 0.5 ratio (ref 0.0–3.2)
Triglycerides: 85 mg/dL (ref 0–149)
VLDL Cholesterol Cal: 16 mg/dL (ref 5–40)

## 2023-07-26 LAB — VITAMIN D 25 HYDROXY (VIT D DEFICIENCY, FRACTURES): Vit D, 25-Hydroxy: 37 ng/mL (ref 30.0–100.0)

## 2023-07-26 LAB — TSH: TSH: 0.561 u[IU]/mL (ref 0.450–4.500)

## 2023-07-28 LAB — CYTOLOGY - PAP
Comment: NEGATIVE
Diagnosis: UNDETERMINED — AB
High risk HPV: NEGATIVE

## 2023-07-29 ENCOUNTER — Ambulatory Visit: Payer: Self-pay | Admitting: Advanced Practice Midwife

## 2023-08-31 IMAGING — MG DIGITAL DIAGNOSTIC BILAT W/ TOMO W/ CAD
6 of 10 series · 6 of 30 positions shown · non-contrast
Comparison: Previous exam(s).

CLINICAL DATA: Callback for bilateral breast masses.



[R MLO synth-2D]
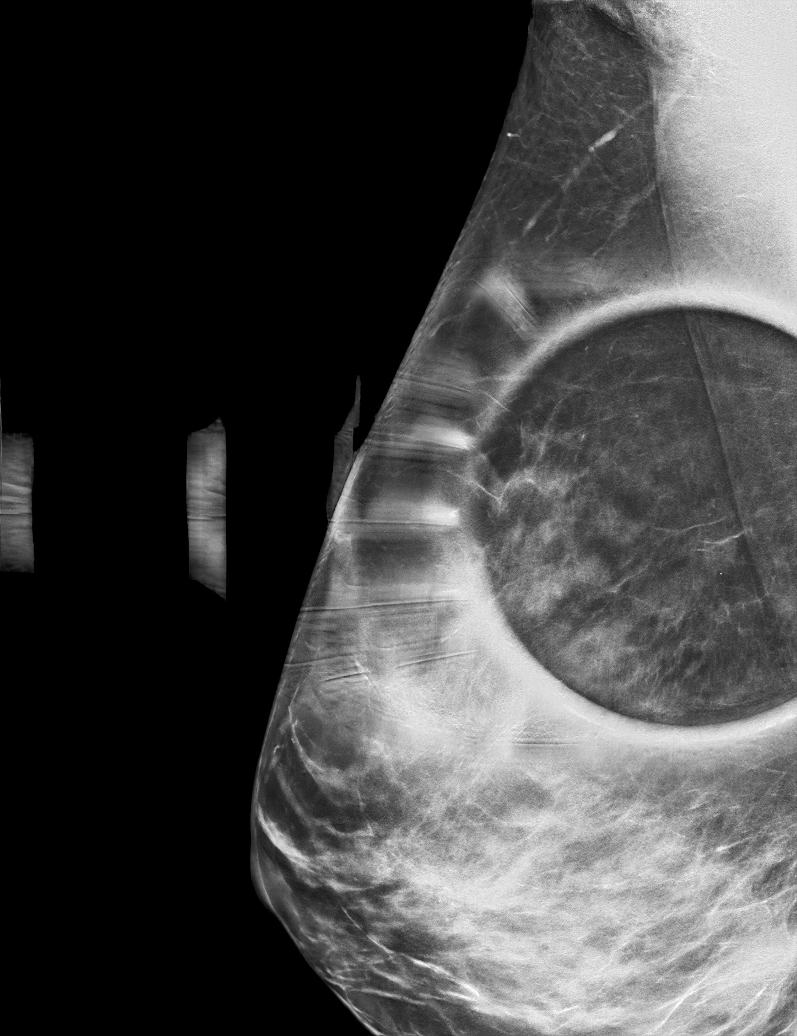

[R ML synth-2D]
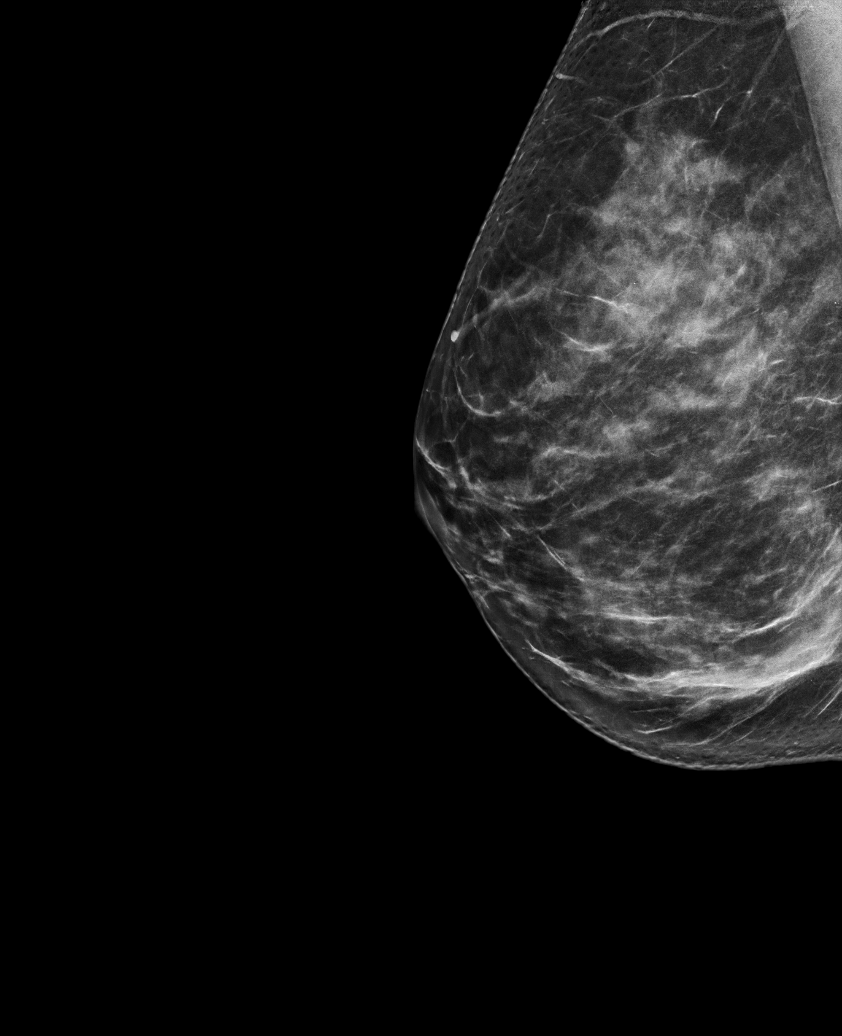

[R LM synth-2D]
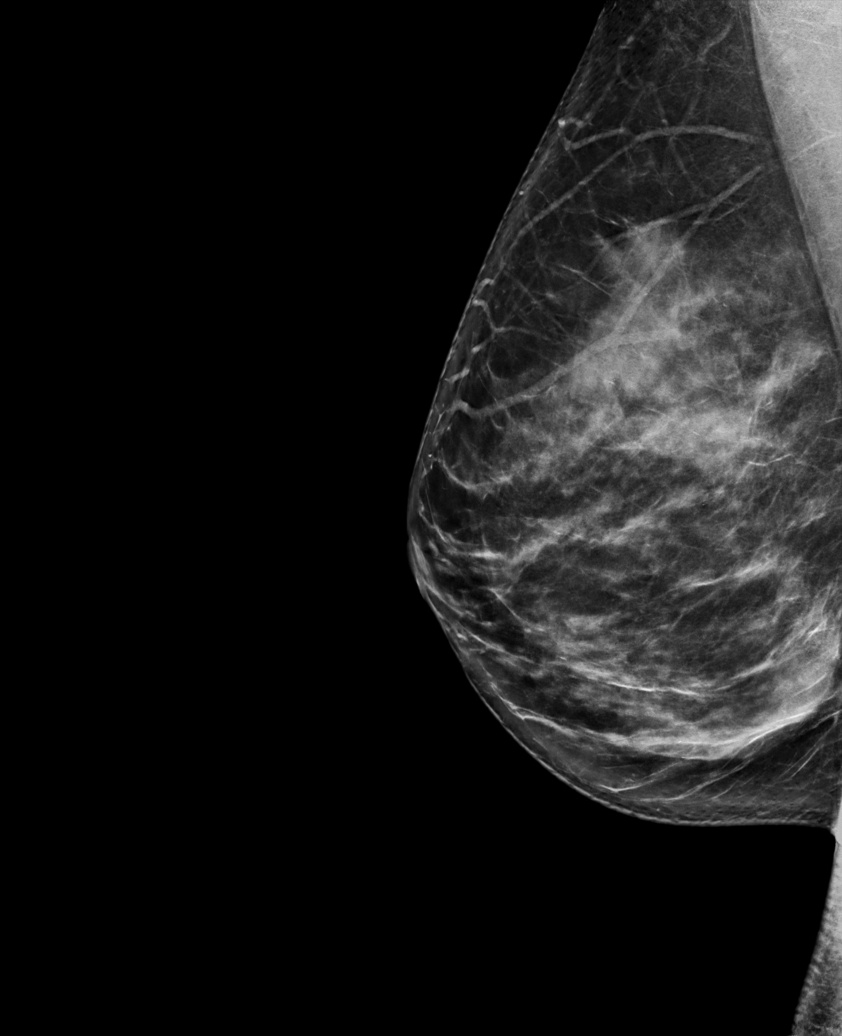

[L LM synth-2D]
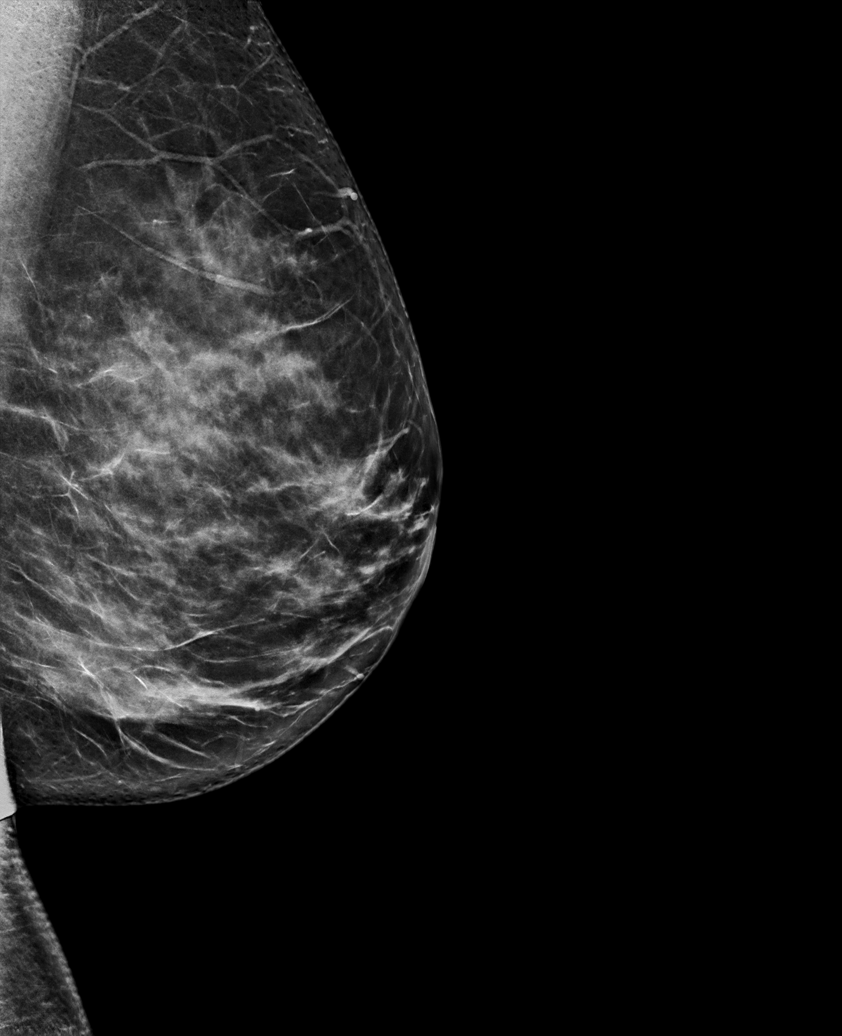

[L CC synth-2D]
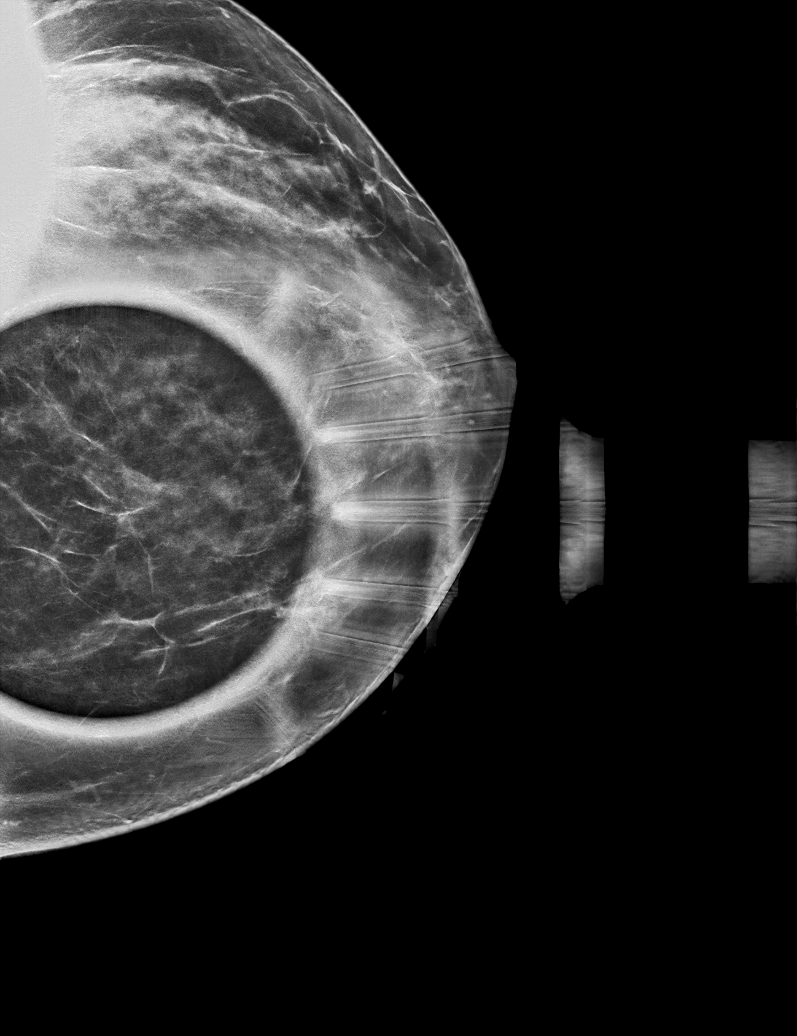

[L LM tomo · tomo slice 41/82.0]
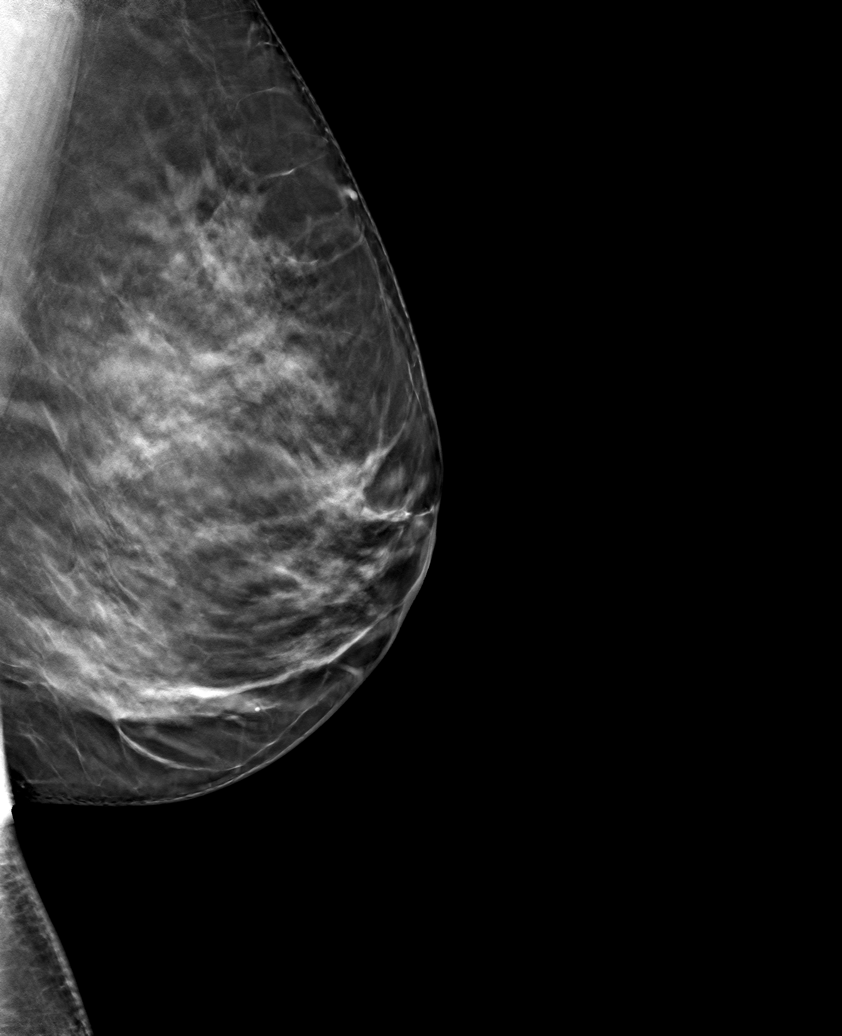

[6 of 30 positions shown; findings below may reference images not displayed]

ACR Breast Density Category c: The breast tissue is heterogeneously
dense, which may obscure small masses.
FINDINGS: Spot compression tomosynthesis views confirm persistence of an oval
circumscribed mass in the RIGHT outer breast at far posterior depth.

Spot compression tomosynthesis views confirm persistence of an oval
circumscribed mass in the LEFT slightly inner breast at middle
depth.

On physical exam, no suspicious mass is appreciated bilaterally.

Targeted ultrasound was performed of the RIGHT outer breast. At 9
o'clock 7 cm from the nipple, there is an oval circumscribed
anechoic mass with posterior acoustic enhancement. It measures 8 x 5
by 7 mm and is consistent with a benign simple cyst. This
corresponds to the oval circumscribed mass noted mammographically.
Multiple additional benign cysts were noted during real-time
examination.

Targeted ultrasound was performed of the LEFT inner and central
breast. At 9 o'clock 1 cm from the nipple, there is an oval
circumscribed anechoic mass with posterior acoustic enhancement. It
measures 6 x 5 x 8 mm and is consistent with a benign simple cyst.
This is favored to correspond to the site of screening mammographic
concern. At 6 o'clock 5 cm from the nipple, there is an oval
circumscribed anechoic mass with posterior acoustic enhancement. It
measures 6 x 3 x 5 mm and is consistent with a benign simple cyst.
Innumerable additional benign cysts were noted during real-time
examination.
IMPRESSION: There are benign cysts at the site of screening mammographic concern
bilaterally.

RECOMMENDATION:
Screening mammogram in one year.(Code:TJ-[id number])

I have discussed the findings and recommendations with the patient.
If applicable, a reminder letter will be sent to the patient
regarding the next appointment.

BI-RADS CATEGORY  2: Benign.

## 2023-09-01 ENCOUNTER — Ambulatory Visit
Admission: RE | Admit: 2023-09-01 | Discharge: 2023-09-01 | Disposition: A | Discharge status: Home / Self Care | Source: Ambulatory Visit | Attending: Advanced Practice Midwife | Admitting: Advanced Practice Midwife

## 2023-09-01 DIAGNOSIS — Z1231 Encounter for screening mammogram for malignant neoplasm of breast: Secondary | ICD-10-CM | POA: Insufficient documentation

## 2023-09-01 DIAGNOSIS — Z01419 Encounter for gynecological examination (general) (routine) without abnormal findings: Secondary | ICD-10-CM

## 2023-09-01 DIAGNOSIS — Z1239 Encounter for other screening for malignant neoplasm of breast: Secondary | ICD-10-CM
